# Patient Record
Sex: Female | Born: 1960 | Race: Black or African American | Hispanic: No | Smoking: Current every day smoker
Health system: Southern US, Community
[De-identification: ages and names within clinical notes are randomized; demographics above are authoritative.]

## PROBLEM LIST (undated history)

## (undated) DIAGNOSIS — E119 Type 2 diabetes mellitus without complications: Secondary | ICD-10-CM

## (undated) DIAGNOSIS — C801 Malignant (primary) neoplasm, unspecified: Secondary | ICD-10-CM

## (undated) DIAGNOSIS — M549 Dorsalgia, unspecified: Secondary | ICD-10-CM

## (undated) DIAGNOSIS — Z973 Presence of spectacles and contact lenses: Secondary | ICD-10-CM

## (undated) DIAGNOSIS — J302 Other seasonal allergic rhinitis: Secondary | ICD-10-CM

## (undated) DIAGNOSIS — Z972 Presence of dental prosthetic device (complete) (partial): Secondary | ICD-10-CM

## (undated) DIAGNOSIS — J45909 Unspecified asthma, uncomplicated: Secondary | ICD-10-CM

## (undated) DIAGNOSIS — Z9289 Personal history of other medical treatment: Secondary | ICD-10-CM

## (undated) DIAGNOSIS — G8929 Other chronic pain: Secondary | ICD-10-CM

## (undated) DIAGNOSIS — Z975 Presence of (intrauterine) contraceptive device: Secondary | ICD-10-CM

## (undated) DIAGNOSIS — Z01419 Encounter for gynecological examination (general) (routine) without abnormal findings: Secondary | ICD-10-CM

## (undated) DIAGNOSIS — L309 Dermatitis, unspecified: Secondary | ICD-10-CM

## (undated) HISTORY — DX: Encounter for gynecological examination (general) (routine) without abnormal findings: Z01.419

## (undated) HISTORY — DX: Presence of spectacles and contact lenses: Z97.3

## (undated) HISTORY — PX: MASTECTOMY: SHX3

## (undated) HISTORY — DX: Presence of dental prosthetic device (complete) (partial): Z97.2

## (undated) HISTORY — PX: APPENDECTOMY: SHX54

## (undated) HISTORY — DX: Dorsalgia, unspecified: M54.9

## (undated) HISTORY — PX: COLONOSCOPY: SHX174

## (undated) HISTORY — DX: Presence of (intrauterine) contraceptive device: Z97.5

## (undated) HISTORY — DX: Other seasonal allergic rhinitis: J30.2

## (undated) HISTORY — DX: Dermatitis, unspecified: L30.9

## (undated) HISTORY — DX: Personal history of other medical treatment: Z92.89

## (undated) HISTORY — DX: Other chronic pain: G89.29

## (undated) HISTORY — PX: OTHER SURGICAL HISTORY: SHX169

---

## 2002-05-09 ENCOUNTER — Encounter: Payer: Self-pay | Admitting: Emergency Medicine

## 2002-05-09 ENCOUNTER — Emergency Department (HOSPITAL_COMMUNITY): Admission: EM | Admit: 2002-05-09 | Discharge: 2002-05-09 | Payer: Self-pay | Admitting: Emergency Medicine

## 2003-02-21 DIAGNOSIS — Z9289 Personal history of other medical treatment: Secondary | ICD-10-CM

## 2003-02-21 HISTORY — DX: Personal history of other medical treatment: Z92.89

## 2003-06-24 ENCOUNTER — Encounter: Admission: RE | Admit: 2003-06-24 | Discharge: 2003-06-24 | Payer: Self-pay | Admitting: Internal Medicine

## 2003-07-08 ENCOUNTER — Encounter: Admission: RE | Admit: 2003-07-08 | Discharge: 2003-07-08 | Payer: Self-pay | Admitting: Internal Medicine

## 2004-02-26 ENCOUNTER — Encounter: Admission: RE | Admit: 2004-02-26 | Discharge: 2004-02-26 | Payer: Self-pay | Admitting: Internal Medicine

## 2006-01-25 ENCOUNTER — Emergency Department (HOSPITAL_COMMUNITY): Admission: EM | Admit: 2006-01-25 | Discharge: 2006-01-25 | Payer: Self-pay | Admitting: Family Medicine

## 2006-09-27 ENCOUNTER — Ambulatory Visit: Payer: Self-pay | Admitting: Internal Medicine

## 2006-09-28 ENCOUNTER — Ambulatory Visit: Payer: Self-pay | Admitting: *Deleted

## 2007-08-07 ENCOUNTER — Emergency Department (HOSPITAL_COMMUNITY): Admission: EM | Admit: 2007-08-07 | Discharge: 2007-08-07 | Payer: Self-pay | Admitting: Emergency Medicine

## 2007-08-23 ENCOUNTER — Emergency Department (HOSPITAL_COMMUNITY): Admission: EM | Admit: 2007-08-23 | Discharge: 2007-08-23 | Payer: Self-pay | Admitting: Emergency Medicine

## 2008-09-11 ENCOUNTER — Emergency Department (HOSPITAL_COMMUNITY): Admission: EM | Admit: 2008-09-11 | Discharge: 2008-09-11 | Payer: Self-pay | Admitting: Emergency Medicine

## 2008-10-20 ENCOUNTER — Emergency Department (HOSPITAL_COMMUNITY): Admission: EM | Admit: 2008-10-20 | Discharge: 2008-10-20 | Payer: Self-pay | Admitting: Family Medicine

## 2009-01-18 ENCOUNTER — Emergency Department (HOSPITAL_COMMUNITY): Admission: EM | Admit: 2009-01-18 | Discharge: 2009-01-18 | Payer: Self-pay | Admitting: Emergency Medicine

## 2009-04-06 ENCOUNTER — Emergency Department (HOSPITAL_COMMUNITY): Admission: EM | Admit: 2009-04-06 | Discharge: 2009-04-06 | Payer: Self-pay | Admitting: Family Medicine

## 2010-03-13 ENCOUNTER — Encounter: Payer: Self-pay | Admitting: Internal Medicine

## 2010-05-11 LAB — CBC
HCT: 37.4 % (ref 36.0–46.0)
Hemoglobin: 13 g/dL (ref 12.0–15.0)
MCHC: 34.8 g/dL (ref 30.0–36.0)
MCV: 92.5 fL (ref 78.0–100.0)
RBC: 4.04 MIL/uL (ref 3.87–5.11)
WBC: 7.5 10*3/uL (ref 4.0–10.5)

## 2010-05-11 LAB — DIFFERENTIAL
Eosinophils Absolute: 0.3 10*3/uL (ref 0.0–0.7)
Lymphs Abs: 1.6 10*3/uL (ref 0.7–4.0)
Neutro Abs: 5.1 10*3/uL (ref 1.7–7.7)

## 2010-05-11 LAB — URIC ACID: Uric Acid, Serum: 7.9 mg/dL — ABNORMAL HIGH (ref 2.4–7.0)

## 2010-05-25 LAB — DIFFERENTIAL
Basophils Absolute: 0 10*3/uL (ref 0.0–0.1)
Basophils Relative: 1 % (ref 0–1)
Eosinophils Absolute: 0.3 10*3/uL (ref 0.0–0.7)
Eosinophils Relative: 4 % (ref 0–5)
Lymphocytes Relative: 21 % (ref 12–46)
Lymphs Abs: 1.5 10*3/uL (ref 0.7–4.0)
Monocytes Absolute: 0.5 10*3/uL (ref 0.1–1.0)
Neutro Abs: 5 10*3/uL (ref 1.7–7.7)
Neutrophils Relative %: 68 % (ref 43–77)

## 2010-05-25 LAB — POCT PREGNANCY, URINE: Preg Test, Ur: NEGATIVE

## 2010-05-25 LAB — CBC
MCV: 92.9 fL (ref 78.0–100.0)
Platelets: 355 10*3/uL (ref 150–400)
WBC: 7.3 10*3/uL (ref 4.0–10.5)

## 2010-05-25 LAB — URINALYSIS, ROUTINE W REFLEX MICROSCOPIC
Glucose, UA: NEGATIVE mg/dL
Ketones, ur: NEGATIVE mg/dL
Leukocytes, UA: NEGATIVE
Nitrite: NEGATIVE

## 2010-05-25 LAB — COMPREHENSIVE METABOLIC PANEL
ALT: 22 U/L (ref 0–35)
AST: 24 U/L (ref 0–37)
Albumin: 3.6 g/dL (ref 3.5–5.2)
Calcium: 10.1 mg/dL (ref 8.4–10.5)
Chloride: 103 mEq/L (ref 96–112)
Creatinine, Ser: 0.58 mg/dL (ref 0.4–1.2)
GFR calc Af Amer: >60 mL/min (ref >60)
Sodium: 139 mEq/L (ref 135–145)

## 2010-05-25 LAB — URINE MICROSCOPIC-ADD ON

## 2010-11-19 ENCOUNTER — Inpatient Hospital Stay (INDEPENDENT_AMBULATORY_CARE_PROVIDER_SITE_OTHER)
Admission: RE | Admit: 2010-11-19 | Discharge: 2010-11-19 | Disposition: A | Discharge status: Home / Self Care | Payer: Self-pay | Source: Ambulatory Visit | Attending: Family Medicine | Admitting: Family Medicine

## 2010-11-19 DIAGNOSIS — S61209A Unspecified open wound of unspecified finger without damage to nail, initial encounter: Secondary | ICD-10-CM

## 2012-02-28 ENCOUNTER — Encounter (HOSPITAL_COMMUNITY): Payer: Self-pay | Admitting: *Deleted

## 2012-02-28 ENCOUNTER — Emergency Department (INDEPENDENT_AMBULATORY_CARE_PROVIDER_SITE_OTHER)
Admission: EM | Admit: 2012-02-28 | Discharge: 2012-02-28 | Disposition: A | Discharge status: Home / Self Care | Payer: Managed Care, Other (non HMO) | Source: Home / Self Care | Attending: Family Medicine | Admitting: Family Medicine

## 2012-02-28 DIAGNOSIS — H109 Unspecified conjunctivitis: Secondary | ICD-10-CM

## 2012-02-28 HISTORY — DX: Unspecified asthma, uncomplicated: J45.909

## 2012-02-28 MED ORDER — TOBRAMYCIN 0.3 % OP SOLN: 1.0000 [drp] | OPHTHALMIC | Status: DC

## 2012-02-28 MED ORDER — TRIAMCINOLONE ACETONIDE 0.025 % EX OINT: TOPICAL_OINTMENT | Freq: Two times a day (BID) | CUTANEOUS | Status: DC

## 2012-02-28 NOTE — ED Notes (Signed)
Pt  Has  Red  Irritated  r eye    That is  Watering   She reports  The   Symptoms  Began last  Pm     She  denys  Any known  fb      The  l  Eye   Is  Watery     As well

## 2012-02-28 NOTE — ED Provider Notes (Signed)
History     CSN: [id number]  Arrival date & time 02/28/12  1145   None     Chief Complaint  Patient presents with  . Conjunctivitis    (Consider location/radiation/quality/duration/timing/severity/associated sxs/prior treatment) Patient is a 52 y.o. female presenting with eye problem. The history is provided by the patient. No language interpreter was used.  Eye Problem  This is a new problem. The current episode started yesterday. The problem occurs constantly. The problem has not changed since onset.The left eye is affected.There was no injury mechanism. The pain is moderate. There is no history of trauma to the eye. There is no known exposure to pink eye. Associated symptoms include discharge and eye redness. Pertinent negatives include no foreign body sensation. She has tried nothing for the symptoms. The treatment provided no relief.   Pt complains of eye irritation and redness.  No injury Past Medical History  Diagnosis Date  . Asthma     History reviewed. No pertinent past surgical history.  No family history on file.  History  Substance Use Topics  . Smoking status: Current Every Day Smoker  . Smokeless tobacco: Not on file  . Alcohol Use: Yes    OB History    Grav Para Term Preterm Abortions TAB SAB Ect Mult Living                  Review of Systems  Eyes: Positive for discharge and redness.  All other systems reviewed and are negative.    Allergies  Review of patient's allergies indicates no known allergies.  Home Medications  No current outpatient prescriptions on file.  BP 156/87  Pulse 88  Temp 98.3 F (36.8 C) (Oral)  Resp 16  SpO2 100%  LMP 02/20/2012  Physical Exam  Vitals reviewed. Constitutional: She appears well-developed and well-nourished.  HENT:  Head: Normocephalic and atraumatic.  Eyes: Conjunctivae normal and EOM are normal. Pupils are equal, round, and reactive to light. Right eye exhibits discharge.       Drainage from left  eye, clear,  conjuntiva injected  Neck: Normal range of motion.  Cardiovascular: Normal rate.   Pulmonary/Chest: Effort normal.  Skin: Skin is warm and dry.  Psychiatric: She has a normal mood and affect.    ED Course  Procedures (including critical care time)  Labs Reviewed - No data to display No results found.   No diagnosis found.    MDM     rx for tobrex opth solution    Lonia Skinner [place], [place] 02/28/12 1421

## 2012-03-01 NOTE — ED Provider Notes (Signed)
Medical screening examination/treatment/procedure(s) were performed by non-physician practitioner and as supervising physician I was immediately available for consultation/collaboration.   MORENO-COLL,Yittel Emrich; MD   Kaidan Harpster Moreno-Coll, MD 03/01/12 0837 

## 2012-05-01 ENCOUNTER — Encounter: Payer: Self-pay | Admitting: Medical

## 2012-05-01 ENCOUNTER — Other Ambulatory Visit: Payer: Self-pay | Admitting: Medical

## 2012-05-01 ENCOUNTER — Telehealth: Payer: Self-pay | Admitting: Family Medicine

## 2012-05-01 ENCOUNTER — Other Ambulatory Visit (HOSPITAL_COMMUNITY)
Admission: RE | Admit: 2012-05-01 | Discharge: 2012-05-01 | Disposition: A | Discharge status: Home / Self Care | Payer: Managed Care, Other (non HMO) | Source: Ambulatory Visit | Attending: Medical | Admitting: Medical

## 2012-05-01 ENCOUNTER — Encounter: Payer: Self-pay | Admitting: Gastroenterology

## 2012-05-01 ENCOUNTER — Ambulatory Visit (INDEPENDENT_AMBULATORY_CARE_PROVIDER_SITE_OTHER): Payer: Managed Care, Other (non HMO) | Admitting: Medical

## 2012-05-01 VITALS — BP 130/80 | HR 68 | Temp 97.9°F | Resp 16 | Ht 67.5 in | Wt 186.0 lb

## 2012-05-01 DIAGNOSIS — N760 Acute vaginitis: Secondary | ICD-10-CM | POA: Insufficient documentation

## 2012-05-01 DIAGNOSIS — Z1231 Encounter for screening mammogram for malignant neoplasm of breast: Secondary | ICD-10-CM

## 2012-05-01 DIAGNOSIS — Z0000 Encounter for general adult medical examination without abnormal findings: Secondary | ICD-10-CM

## 2012-05-01 DIAGNOSIS — Z124 Encounter for screening for malignant neoplasm of cervix: Secondary | ICD-10-CM

## 2012-05-01 DIAGNOSIS — Z113 Encounter for screening for infections with a predominantly sexual mode of transmission: Secondary | ICD-10-CM

## 2012-05-01 DIAGNOSIS — N951 Menopausal and female climacteric states: Secondary | ICD-10-CM

## 2012-05-01 DIAGNOSIS — Z975 Presence of (intrauterine) contraceptive device: Secondary | ICD-10-CM

## 2012-05-01 DIAGNOSIS — Z30432 Encounter for removal of intrauterine contraceptive device: Secondary | ICD-10-CM

## 2012-05-01 DIAGNOSIS — Z01419 Encounter for gynecological examination (general) (routine) without abnormal findings: Secondary | ICD-10-CM | POA: Insufficient documentation

## 2012-05-01 DIAGNOSIS — Z1151 Encounter for screening for human papillomavirus (HPV): Secondary | ICD-10-CM | POA: Insufficient documentation

## 2012-05-01 DIAGNOSIS — F17200 Nicotine dependence, unspecified, uncomplicated: Secondary | ICD-10-CM

## 2012-05-01 DIAGNOSIS — Z1211 Encounter for screening for malignant neoplasm of colon: Secondary | ICD-10-CM

## 2012-05-01 DIAGNOSIS — Z1239 Encounter for other screening for malignant neoplasm of breast: Secondary | ICD-10-CM

## 2012-05-01 DIAGNOSIS — Z7189 Other specified counseling: Secondary | ICD-10-CM

## 2012-05-01 DIAGNOSIS — Z23 Encounter for immunization: Secondary | ICD-10-CM

## 2012-05-01 LAB — CBC WITH DIFFERENTIAL/PLATELET
Basophils Relative: 0 % (ref 0–1)
HCT: 42.3 % (ref 36.0–46.0)
Lymphocytes Relative: 24 % (ref 12–46)
Lymphs Abs: 1.9 10*3/uL (ref 0.7–4.0)
MCH: 30.3 pg (ref 26.0–34.0)
MCV: 89.1 fL (ref 78.0–100.0)
Monocytes Absolute: 0.6 10*3/uL (ref 0.1–1.0)
Monocytes Relative: 8 % (ref 3–12)
Neutrophils Relative %: 66 % (ref 43–77)
Platelets: 328 10*3/uL (ref 150–400)
RBC: 4.75 MIL/uL (ref 3.87–5.11)
RDW: 12.7 % (ref 11.5–15.5)

## 2012-05-01 LAB — COMPREHENSIVE METABOLIC PANEL
AST: 23 U/L (ref 0–37)
Alkaline Phosphatase: 91 U/L (ref 39–117)
BUN: 12 mg/dL (ref 6–23)
Calcium: 11.3 mg/dL — ABNORMAL HIGH (ref 8.4–10.5)
Chloride: 101 mEq/L (ref 96–112)
Creat: 0.71 mg/dL (ref 0.50–1.10)
Glucose, Bld: 185 mg/dL — ABNORMAL HIGH (ref 70–99)
Total Bilirubin: 0.3 mg/dL (ref 0.3–1.2)

## 2012-05-01 LAB — LIPID PANEL
HDL: 44 mg/dL (ref >39)
LDL Cholesterol: 142 mg/dL — ABNORMAL HIGH (ref 0–99)
Total CHOL/HDL Ratio: 5.2 Ratio

## 2012-05-01 LAB — POCT URINALYSIS DIPSTICK
Blood, UA: NEGATIVE
Spec Grav, UA: 1.02
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
## 2012-05-01 NOTE — Telephone Encounter (Signed)
Patient is aware of her appointment at [facility] on 05/06/12 @ 345 pm for her mammogram. CLS Patient is also aware of her appointment at [facility]  For her colonoscopy nurse visit 05/29/12 @ 130 pm and her colonoscopy 06/12/12 @ 900 am. CLS

## 2012-05-01 NOTE — Patient Instructions (Addendum)
Recommendations today:  Consider quitting tobacco  When we get the Tdap and pneumococcal vaccines come in, we will update these.  Call back if you haven't heard from [place] about this in 1 month  Go see eye doctor soon for baseline evaluation, recheck, glaucoma screening  We will call with lab results  Exercise regularly, eat a healthy low fat diet  Go for mammogram and first screening colonoscopy  consider the breast cancer gene screening   BRCA-1 and BRCA-2 BRCA-1 and BRCA-2 are 2 genes that are linked with hereditary breast and ovarian cancers. About 200,000 women are diagnosed with invasive breast cancer each year and about 23,000 with ovarian cancer (according to the American Cancer Society). Of these cancers, about 5% to 10% will be due to a mutation in one of the BRCA genes. Men can also inherit an increased risk of developing breast cancer, primarily from an alteration in the BRCA-2 gene.  Individuals with mutations in BRCA1 or BRCA2 have significantly elevated risks for breast cancer (up to 80% lifetime risk), ovarian cancer (up to 40% lifetime risk), bilateral breast cancer and other types of cancers. BRCA mutations are inherited and passed from generation to generation. One half of the time, they are passed from the father's side of the family.  The DNA in white blood cells is used to detect mutations in the BRCA genes. While the gene products (proteins) of the BRCA genes act only in breast and ovarian tissue, the genes are present in every cell of the body and blood is the most easily accessible source of that DNA. PREPARATION FOR TEST The test for BRCA mutations is done on a blood sample collected by needle from a vein in the arm. The test does not require surgical biopsy of breast or ovarian tissue.  NORMAL FINDINGS No genetic mutations. Ranges for normal findings may vary among different laboratories and hospitals. You should always check with your doctor after having lab work or  other tests done to discuss the meaning of your test results and whether your values are considered within normal limits. MEANING OF TEST  Your caregiver will go over the test results with you and discuss the importance and meaning of your results, as well as treatment options and the need for additional tests if necessary. OBTAINING THE TEST RESULTS It is your responsibility to obtain your test results. Ask the lab or department performing the test when and how you will get your results. OTHER THINGS TO KNOW Your test results may have implications for other family members. When one member of a family is tested for BRCA mutations, issues often arise about how or whether to share this information with other family members. Seek advice from a genetic counselor about communication of result with your family members.  Pre and post test consultation with a health care provider knowledgeable about genetic testing cannot be overemphasized.  There are many issues to be considered when preparing for a genetic test and upon learning the results, and a genetic counselor has the knowledge and experience to help you sort through them.  If the BRCA test is positive, the options include increased frequency of check-ups (e.g., mammography, blood tests for CA-125, or transvaginal ultrasonography); medications that could reduce risk (e.g., oral contraceptives or tamoxifen); or surgical removal of the ovaries or breasts. There are a number of variables involved and it is important to discuss your options with your doctor and genetic counselor. Research studies have reported that for every 1000 women negative  for BRCA mutations, between 12 and 45 of them will develop breast cancer by age 52 and between 3 and 4 will develop ovarian cancer by age 64. The risk increases with age. The test can be ordered by a doctor, preferably by one who can also offer genetic counseling. The blood sample will be sent to a laboratory that  specializes in BRCA testing. The American Society of Clinical Oncology and the National Breast Cancer Coalition encourage women seeking the test to participate in long-term outcome studies to help gather information on the effectiveness of different check-up and treatment options. Document Released: 03/02/2004 Document Revised: 05/01/2011 Document Reviewed: 01/13/2008 [facility] Patient Information 2013 [place], [place].     We removed the IUD today.    You may experience some cramping over the next few days.  You can take some Aleve OTC for this.    If moderate to severe abdominal/pelvic pain, fever, or bleeding, then recheck immediately.

## 2012-05-02 LAB — HIV ANTIBODY (ROUTINE TESTING W REFLEX): HIV: NONREACTIVE

## 2012-05-06 ENCOUNTER — Ambulatory Visit (HOSPITAL_COMMUNITY): Payer: Managed Care, Other (non HMO)

## 2012-05-08 ENCOUNTER — Institutional Professional Consult (permissible substitution): Payer: Self-pay | Admitting: Medical

## 2012-05-14 ENCOUNTER — Ambulatory Visit (HOSPITAL_COMMUNITY)
Admission: RE | Admit: 2012-05-14 | Discharge: 2012-05-14 | Disposition: A | Discharge status: Home / Self Care | Payer: Managed Care, Other (non HMO) | Source: Ambulatory Visit | Attending: Medical | Admitting: Medical

## 2012-05-14 DIAGNOSIS — Z1231 Encounter for screening mammogram for malignant neoplasm of breast: Secondary | ICD-10-CM

## 2012-05-20 ENCOUNTER — Ambulatory Visit (INDEPENDENT_AMBULATORY_CARE_PROVIDER_SITE_OTHER): Payer: Managed Care, Other (non HMO) | Admitting: Medical

## 2012-05-20 ENCOUNTER — Encounter: Payer: Self-pay | Admitting: Medical

## 2012-05-20 VITALS — BP 138/82 | HR 88 | Temp 97.8°F | Resp 16 | Wt 188.0 lb

## 2012-05-20 DIAGNOSIS — R7301 Impaired fasting glucose: Secondary | ICD-10-CM

## 2012-05-20 DIAGNOSIS — R232 Flushing: Secondary | ICD-10-CM

## 2012-05-20 DIAGNOSIS — F17200 Nicotine dependence, unspecified, uncomplicated: Secondary | ICD-10-CM

## 2012-05-20 DIAGNOSIS — N951 Menopausal and female climacteric states: Secondary | ICD-10-CM

## 2012-05-20 MED ORDER — METFORMIN HCL 500 MG PO TABS: 500.0000 mg | ORAL_TABLET | Freq: Two times a day (BID) | ORAL | Status: AC

## 2012-05-20 MED ORDER — VARENICLINE TARTRATE 0.5 MG X 11 & 1 MG X 42 PO MISC: ORAL | Status: DC

## 2012-05-20 NOTE — Patient Instructions (Addendum)
Impaired fasting glucose - new diagnosis of diabetes type II.   Begin Metformin once daily for a week, then go to twice daily.   I recommend exercising most days of the week using a type of exercise that they would enjoy and stick to such as walking, running, swimming, hiking, biking, aerobics, etc.  I recommend a healthy diet.    Do's:   whole grains such as whole grain pasta, rice, whole grains breads and whole grain cereals.  Use small quantities such as 1/2 cup per serving or 2 slices of bread per serving.    Eat 3-5 fruits daily  Eat beans at least once daily  Eat almonds in small quantities at least 3 days per week    If they eat meat, I recommend small portions of lean meats such as chicken, fish, and [place].  Eat as much NON corn and NON potato vegetables as they like, particularly raw or steamed  Drink several large glasses of water daily  Cautions:  Limit red meat  Limit corn and potatoes  Limit sweets, cake, pie, candy  Limit beer and alcohol  Avoid fried food, fast food, large portions  Avoid sugary drinks such as regular soda and sweet tea  Elevated calcium - we will call with results  Tobacco - I recommend you quit tobacco completely.  Begin Chantix medication, call the counseling hotline below, and go ahead and put a quit date on your refrigerator.  Work on exercising regularly, eating a healthy low fat diet, and trying to lose 15 lb in the next 3-4 months.  Perimenopause Perimenopause is the time when your body begins to move into the menopause (no menstrual period for 12 straight months). It is a natural process. Perimenopause can begin 2 to 8 years before the menopause and usually lasts for one year after the menopause. During this time, your ovaries may or may not produce an egg. The ovaries vary in their production of estrogen and progesterone hormones each month. This can cause irregular menstrual periods, difficulty in getting pregnant, vaginal bleeding  between periods and uncomfortable symptoms. CAUSES  Irregular production of the ovarian hormones, estrogen and progesterone, and not ovulating every month.  Other causes include:  Tumor of the pituitary gland in the brain.  Medical disease that affects the ovaries.  Radiation treatment.  Chemotherapy.  Unknown causes.  Heavy smoking and excessive alcohol intake can bring on perimenopause sooner. SYMPTOMS   Hot flashes.  Night sweats.  Irregular menstrual periods.  Decrease sex drive.  Vaginal dryness.  Headaches.  Mood swings.  Depression.  Memory problems.  Irritability.  Tiredness.  Weight gain.  Trouble getting pregnant.  The beginning of losing bone cells (osteoporosis).  The beginning of hardening of the arteries (atherosclerosis). DIAGNOSIS  Your caregiver will make a diagnosis by analyzing your age, menstrual history and your symptoms. They will do a physical exam noting any changes in your body, especially your female organs. Female hormone tests may or may not be helpful depending on the amount and when you produce the female hormones. However, other hormone tests may be helpful (ex. thyroid hormone) to rule out other problems. TREATMENT  The decision to treat during the perimenopause should be made by you and your caregiver depending on how the symptoms are affecting you and your life style. There are various treatments available such as:  Treating individual symptoms with a specific medication for that symptom (ex. antidepressant for depression).  Herbal medications that can help specific symptoms.  Counseling.  Group therapy.  No treatment. HOME CARE INSTRUCTIONS   Before seeing your caregiver, make a list of your menstrual periods (when the occur, how heavy they are, how long between periods and how long they last), your symptoms and when they started.  Take the medication as recommended by your caregiver.  Sleep and  rest.  Exercise.  Eat a diet that contains calcium (good for your bones) and soy (acts like estrogen hormone).  Do not smoke.  Avoid alcoholic beverages.  Taking vitamin E may help in certain cases.  Take calcium and vitamin D supplements to help prevent bone loss.  Group therapy is sometimes helpful.  Acupuncture may help in some cases. SEEK MEDICAL CARE IF:   You have any of the above and want to know if it is perimenopause.  You want advice and treatment for any of your symptoms mentioned above.  You need a referral to a specialist (gynecologist, psychiatrist or psychologist). SEEK IMMEDIATE MEDICAL CARE IF:   You have vaginal bleeding.  Your period lasts longer than 8 days.  You periods are recurring sooner than 21 days.  You have bleeding after intercourse.  You have severe depression.  You have pain when you urinate.  You have severe headaches.  You develop vision problems. Document Released: 03/16/2004 Document Revised: 05/01/2011 Document Reviewed: 12/05/2007 [facility] Patient Information 2013 [place], [place].    YOU CAN QUIT SMOKING!  Talk to your medical provider about using medicines to help you quit. These include nicotine replacement gum, lozenges, or skin patches.  Consider calling 1-800-QUIT-NOW, a toll free 24/7 hotline with free counseling to help you quit.  If you are ready to quit smoking or are thinking about it, congratulations! You have chosen to help yourself be healthier and live longer! There are lots of different ways to quit smoking. Nicotine gum, nicotine patches, a nicotine inhaler, or nicotine nasal spray can help with physical craving. Hypnosis, support groups, and medicines help break the habit of smoking. TIPS TO GET OFF AND STAY OFF CIGARETTES  Learn to predict your moods. Do not let a bad situation be your excuse to have a cigarette. Some situations in your life might tempt you to have a cigarette.   Ask friends and co-workers  not to smoke around you.   Make your home smoke-free.   Never have "just one" cigarette. It leads to wanting another and another. Remind yourself of your decision to quit.   On a card, make a list of your reasons for not smoking. Read it at least the same number of times a day as you have a cigarette. Tell yourself everyday, "I do not want to smoke. I choose not to smoke."   Ask someone at home or work to help you with your plan to quit smoking.   Have something planned after you eat or have a cup of coffee. Take a walk or get other exercise to perk you up. This will help to keep you from overeating.   Try a relaxation exercise to calm you down and decrease your stress. Remember, you may be tense and nervous the first two weeks after you quit. This will pass.   Find new activities to keep your hands busy. Play with a pen, coin, or rubber band. Doodle or draw things on paper.   Brush your teeth right after eating. This will help cut down the craving for the taste of tobacco after meals. You can try mouthwash too.   Try gum,  breath mints, or diet candy to keep something in your mouth.  IF YOU SMOKE AND WANT TO QUIT:  Do not stock up on cigarettes. Never buy a carton. Wait until one pack is finished before you buy another.   Never carry cigarettes with you at work or at home.   Keep cigarettes as far away from you as possible. Leave them with someone else.   Never carry matches or a lighter with you.   Ask yourself, "Do I need this cigarette or is this just a reflex?"   Bet with someone that you can quit. Put cigarette money in a piggy bank every morning. If you smoke, you give up the money. If you do not smoke, by the end of the week, you keep the money.   Keep trying. It takes 21 days to change a habit!  Document Released: 12/03/2008 Document Revised: 10/19/2010 Document Reviewed: 12/03/2008 [facility] Patient Information 2012 [place], [place].
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
## 2012-05-21 ENCOUNTER — Other Ambulatory Visit: Payer: Self-pay | Admitting: Medical

## 2012-05-21 DIAGNOSIS — E559 Vitamin D deficiency, unspecified: Secondary | ICD-10-CM

## 2012-05-21 DIAGNOSIS — F17200 Nicotine dependence, unspecified, uncomplicated: Secondary | ICD-10-CM

## 2012-05-21 LAB — PTH, INTACT AND CALCIUM
Calcium, Total (PTH): 10.8 mg/dL — ABNORMAL HIGH (ref 8.4–10.5)
PTH: 49.1 pg/mL (ref 14.0–72.0)

## 2012-05-21 LAB — PHOSPHORUS: Phosphorus: 3.3 mg/dL (ref 2.3–4.6)

## 2012-05-21 LAB — GLUCOSE, FASTING: Glucose, Fasting: 239 mg/dL — ABNORMAL HIGH (ref 70–99)

## 2012-05-21 MED ORDER — ERGOCALCIFEROL 1.25 MG (50000 UT) PO CAPS: 50000.0000 [IU] | ORAL_CAPSULE | ORAL | Status: DC

## 2012-05-27 ENCOUNTER — Ambulatory Visit
Admission: RE | Admit: 2012-05-27 | Discharge: 2012-05-27 | Disposition: A | Discharge status: Home / Self Care | Payer: Managed Care, Other (non HMO) | Source: Ambulatory Visit | Attending: Medical | Admitting: Medical

## 2012-05-27 DIAGNOSIS — F17200 Nicotine dependence, unspecified, uncomplicated: Secondary | ICD-10-CM

## 2012-06-05 ENCOUNTER — Encounter: Payer: Self-pay | Admitting: Internal Medicine

## 2012-06-05 ENCOUNTER — Telehealth: Payer: Self-pay | Admitting: *Deleted

## 2012-06-05 NOTE — Telephone Encounter (Signed)
Patient did not show for previsit appointment at 800 am 06/05/12. Phoned patient, she thought appointment was at 9 am, she is unable to come any other time today. Colonoscopy scheduled for 06/12/12. No previsit appointments available before procedure date. Patient desires to cancel appointment and she will call back at another time and reschedule both previsit and colonoscopy.

## 2012-06-12 ENCOUNTER — Encounter: Payer: Managed Care, Other (non HMO) | Admitting: Gastroenterology

## 2012-06-19 ENCOUNTER — Ambulatory Visit: Payer: Managed Care, Other (non HMO) | Admitting: Medical

## 2013-08-20 ENCOUNTER — Telehealth: Payer: Self-pay | Admitting: Internal Medicine

## 2013-08-20 NOTE — Telephone Encounter (Signed)
Faxed medical records over to [facility] medicine @ [phone]

## 2016-07-25 ENCOUNTER — Ambulatory Visit (INDEPENDENT_AMBULATORY_CARE_PROVIDER_SITE_OTHER): Payer: Managed Care, Other (non HMO) | Admitting: Physician Assistant

## 2017-01-01 ENCOUNTER — Emergency Department (HOSPITAL_COMMUNITY): Payer: Medicare HMO

## 2017-01-01 ENCOUNTER — Other Ambulatory Visit: Payer: Self-pay

## 2017-01-01 ENCOUNTER — Encounter (HOSPITAL_COMMUNITY): Payer: Self-pay

## 2017-01-01 ENCOUNTER — Emergency Department (HOSPITAL_COMMUNITY)
Admission: EM | Admit: 2017-01-01 | Discharge: 2017-01-01 | Disposition: A | Discharge status: Home / Self Care | Payer: Medicare HMO | Attending: Emergency Medicine | Admitting: Emergency Medicine

## 2017-01-01 DIAGNOSIS — K529 Noninfective gastroenteritis and colitis, unspecified: Secondary | ICD-10-CM

## 2017-01-01 DIAGNOSIS — R112 Nausea with vomiting, unspecified: Secondary | ICD-10-CM | POA: Diagnosis not present

## 2017-01-01 DIAGNOSIS — Z853 Personal history of malignant neoplasm of breast: Secondary | ICD-10-CM | POA: Insufficient documentation

## 2017-01-01 DIAGNOSIS — R1084 Generalized abdominal pain: Secondary | ICD-10-CM | POA: Diagnosis present

## 2017-01-01 DIAGNOSIS — J45909 Unspecified asthma, uncomplicated: Secondary | ICD-10-CM | POA: Diagnosis not present

## 2017-01-01 DIAGNOSIS — E119 Type 2 diabetes mellitus without complications: Secondary | ICD-10-CM | POA: Insufficient documentation

## 2017-01-01 DIAGNOSIS — F17200 Nicotine dependence, unspecified, uncomplicated: Secondary | ICD-10-CM | POA: Insufficient documentation

## 2017-01-01 DIAGNOSIS — R197 Diarrhea, unspecified: Secondary | ICD-10-CM

## 2017-01-01 HISTORY — DX: Malignant (primary) neoplasm, unspecified: C80.1

## 2017-01-01 HISTORY — DX: Type 2 diabetes mellitus without complications: E11.9

## 2017-01-01 LAB — I-STAT CG4 LACTIC ACID, ED
LACTIC ACID, VENOUS: 3.31 mmol/L — AB (ref 0.5–1.9)
Lactic Acid, Venous: 3.05 mmol/L (ref 0.5–1.9)
Lactic Acid, Venous: 4 mmol/L (ref 0.5–1.9)

## 2017-01-01 LAB — CBC
HCT: 42.8 % (ref 36.0–46.0)
Hemoglobin: 14.8 g/dL (ref 12.0–15.0)
MCH: 30.8 pg (ref 26.0–34.0)
MCHC: 34.6 g/dL (ref 30.0–36.0)
MCV: 89 fL (ref 78.0–100.0)
Platelets: 330 10*3/uL (ref 150–400)
RBC: 4.81 MIL/uL (ref 3.87–5.11)
RDW: 12.6 % (ref 11.5–15.5)
WBC: 7.7 10*3/uL (ref 4.0–10.5)

## 2017-01-01 LAB — COMPREHENSIVE METABOLIC PANEL
ALT: 29 U/L (ref 14–54)
AST: 35 U/L (ref 15–41)
Albumin: 4.7 g/dL (ref 3.5–5.0)
Alkaline Phosphatase: 82 U/L (ref 38–126)
Anion gap: 11 (ref 5–15)
BILIRUBIN TOTAL: 0.6 mg/dL (ref 0.3–1.2)
BUN: 10 mg/dL (ref 6–20)
CALCIUM: 10.5 mg/dL — AB (ref 8.9–10.3)
CO2: 23 mmol/L (ref 22–32)
Chloride: 103 mmol/L (ref 101–111)
Creatinine, Ser: 0.74 mg/dL (ref 0.44–1.00)
GFR calc non Af Amer: >60 mL/min (ref >60)
Glucose, Bld: 198 mg/dL — ABNORMAL HIGH (ref 65–99)
Potassium: 3.7 mmol/L (ref 3.5–5.1)
SODIUM: 137 mmol/L (ref 135–145)
Total Protein: 7.9 g/dL (ref 6.5–8.1)

## 2017-01-01 LAB — URINALYSIS, ROUTINE W REFLEX MICROSCOPIC
BACTERIA UA: NONE SEEN
BILIRUBIN URINE: NEGATIVE
Glucose, UA: NEGATIVE mg/dL
Ketones, ur: 5 mg/dL — AB
LEUKOCYTES UA: NEGATIVE
NITRITE: NEGATIVE
Protein, ur: NEGATIVE mg/dL
pH: 5 (ref 5.0–8.0)

## 2017-01-01 LAB — I-STAT TROPONIN, ED: Troponin i, poc: 0 ng/mL (ref 0.00–0.08)

## 2017-01-01 LAB — LIPASE, BLOOD: Lipase: 30 U/L (ref 11–51)

## 2017-01-01 MED ORDER — MORPHINE SULFATE (PF) 4 MG/ML IV SOLN
4.0000 mg | Freq: Once | INTRAVENOUS | Status: AC
  Administered 2017-01-01: 4 mg via INTRAVENOUS
  Filled 2017-01-01: qty 1

## 2017-01-01 MED ORDER — SODIUM CHLORIDE 0.9 % IV BOLUS (SEPSIS)
1000.0000 mL | Freq: Once | INTRAVENOUS | Status: AC
  Administered 2017-01-01: 1000 mL via INTRAVENOUS

## 2017-01-01 MED ORDER — LOPERAMIDE HCL 2 MG PO CAPS: 2.0000 mg | ORAL_CAPSULE | Freq: Every evening | ORAL | 0 refills | Status: DC | PRN

## 2017-01-01 MED ORDER — ONDANSETRON HCL 4 MG/2ML IJ SOLN
4.0000 mg | Freq: Once | INTRAMUSCULAR | Status: AC
  Administered 2017-01-01: 4 mg via INTRAVENOUS
  Filled 2017-01-01: qty 2

## 2017-01-01 MED ORDER — ONDANSETRON 4 MG PO TBDP
4.0000 mg | ORAL_TABLET | Freq: Once | ORAL | Status: AC | PRN
  Administered 2017-01-01: 4 mg via ORAL
  Filled 2017-01-01: qty 1

## 2017-01-01 MED ORDER — FENTANYL CITRATE (PF) 100 MCG/2ML IJ SOLN
50.0000 ug | INTRAMUSCULAR | Status: DC | PRN
  Administered 2017-01-01: 50 ug via NASAL
  Filled 2017-01-01: qty 2

## 2017-01-01 MED ORDER — ONDANSETRON 4 MG PO TBDP: 4.0000 mg | ORAL_TABLET | Freq: Three times a day (TID) | ORAL | Status: DC | PRN

## 2017-01-01 MED ORDER — IOPAMIDOL (ISOVUE-300) INJECTION 61%
INTRAVENOUS | Status: AC
  Administered 2017-01-01: 100 mL
  Filled 2017-01-01: qty 100

## 2017-01-01 MED ORDER — ONDANSETRON 8 MG PO TBDP: 8.0000 mg | ORAL_TABLET | Freq: Three times a day (TID) | ORAL | 0 refills | Status: DC | PRN

## 2017-01-01 MED ORDER — IBUPROFEN 600 MG PO TABS: 600.0000 mg | ORAL_TABLET | Freq: Four times a day (QID) | ORAL | 0 refills | Status: AC | PRN
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
## 2017-01-01 NOTE — ED Triage Notes (Signed)
Per Pt, Pt is coming from home with complaints of severe generalized abdominal pain that started this morning. Reports N/V. Pt has hx of diabetes.

## 2017-01-01 NOTE — ED Notes (Signed)
Pt drank cup of ginger ale with no issues, except it was too cold.

## 2017-01-01 NOTE — ED Notes (Signed)
I Stat Lactic Acid results shown to [employer] PA

## 2017-01-01 NOTE — Discharge Instructions (Signed)
We are not sure the exact cause behind your nausea, vomiting and diarrhea. We suspect your symptoms could be due to a mild intestinal infection, and if so, symptoms iwll improve over the next 3-5 days.  Please hydrate well. Clear liquid diet should be utilized for the first 2-3 days. Take the meds prescribed for your symptoms, and take the diarrhea medicines only at night time.  Please return to the ER if your symptoms worsen; you have increased pain, fevers, chills, inability to keep any medications down, confusion, bloody stools. Otherwise see the outpatient doctor as requested.

## 2017-01-01 NOTE — ED Provider Notes (Addendum)
  Physical Exam  BP (!) 175/79   Pulse 76   Temp (!) 97.5 F (36.4 C) (Oral)   Resp (!) 24   Ht 5\' 8"  (1.727 m)   Wt 81.6 kg (180 lb)   LMP 04/19/2012   SpO2 100%   BMI 27.37 kg/m   Physical Exam  ED Course  Procedures  MDM  Assuming care of patient at 4:00 pm  Patient came in with chief complaint of abdominal pain, nausea, vomiting.  Patient has history of diabetes.  CT scan of the abdomen was obtained and did not show any acute findings  Concerning findings are as following elevated lactate. Important pending results are repeat lactate.  Patient had no complains, no concerns from the nursing side. Will continue to monitor. Pt is feeling better.  Plan is to start oral challenge, ensure patient is get to saline boluses, repeat lactate.  6:03 PM Patient's lactate went up from 3->4.  I reassessed patient and she reports that she is indeed feeling better.  She has been given some ginger ale and has tolerated that thus far.  On exam patient has no focal abdominal tenderness or peritoneal findings.  She denies any chest pain, shortness of breath, new back pain, flulike symptoms.  Patient was doing well yesterday, and even clean her home.   Lower extremity pulses equal.  Cap refill is less than 3 seconds.  Elevated lactate could be because of metformin, and not due to sepsis or severe hypovolemia. Plan is to watch patient in the ER a little bit longer.  Repeat lactate will be ordered.  Results from the ER workup discussed with the patient face to face and all questions answered to the best of my ability.   Varney Biles, MD 01/01/17 1805  8:31 PM Patient's repeat lactate is 3.35. No IV hydration was given after the previous lactate.  Patient continues to feel well.  Stable for discharge. Strict ER return precautions have been discussed, and patient is agreeing with the plan and is comfortable with the workup done and the recommendations from the ER.    Varney Biles,  MD 01/01/17 2032

## 2017-01-01 NOTE — ED Notes (Signed)
Pt requested ibuprofen for toothache from a dental procedure earlier in the week.

## 2017-01-01 NOTE — ED Notes (Signed)
Pt taken to CT.

## 2017-01-01 NOTE — ED Notes (Signed)
Pt unable to get urine sample at this time  

## 2017-01-01 NOTE — ED Notes (Signed)
Pt verbalized understanding of d/c instructions and has no further questions. VSS, NAD. Pt removed all belongings from room.  

## 2017-08-02 ENCOUNTER — Telehealth: Payer: Self-pay | Admitting: Nurse Practitioner

## 2017-08-02 NOTE — Telephone Encounter (Signed)
Appointments scheduled letter/calendar mailed per 6/13 los

## 2018-01-20 ENCOUNTER — Other Ambulatory Visit: Payer: Self-pay

## 2018-01-20 ENCOUNTER — Emergency Department (HOSPITAL_COMMUNITY)
Admission: EM | Admit: 2018-01-20 | Discharge: 2018-01-20 | Disposition: A | Discharge status: Home / Self Care | Payer: Medicare HMO | Attending: Emergency Medicine | Admitting: Emergency Medicine

## 2018-01-20 ENCOUNTER — Emergency Department (HOSPITAL_COMMUNITY): Payer: Medicare HMO

## 2018-01-20 ENCOUNTER — Encounter (HOSPITAL_COMMUNITY): Payer: Self-pay | Admitting: *Deleted

## 2018-01-20 DIAGNOSIS — R51 Headache: Secondary | ICD-10-CM | POA: Insufficient documentation

## 2018-01-20 DIAGNOSIS — E119 Type 2 diabetes mellitus without complications: Secondary | ICD-10-CM | POA: Insufficient documentation

## 2018-01-20 DIAGNOSIS — J069 Acute upper respiratory infection, unspecified: Secondary | ICD-10-CM | POA: Diagnosis not present

## 2018-01-20 DIAGNOSIS — F17210 Nicotine dependence, cigarettes, uncomplicated: Secondary | ICD-10-CM | POA: Diagnosis not present

## 2018-01-20 DIAGNOSIS — Z79899 Other long term (current) drug therapy: Secondary | ICD-10-CM | POA: Insufficient documentation

## 2018-01-20 DIAGNOSIS — Z853 Personal history of malignant neoplasm of breast: Secondary | ICD-10-CM | POA: Diagnosis not present

## 2018-01-20 DIAGNOSIS — J4531 Mild persistent asthma with (acute) exacerbation: Secondary | ICD-10-CM | POA: Diagnosis not present

## 2018-01-20 DIAGNOSIS — R062 Wheezing: Secondary | ICD-10-CM | POA: Diagnosis present

## 2018-01-20 LAB — CBG MONITORING, ED: Glucose-Capillary: 129 mg/dL — ABNORMAL HIGH (ref 70–99)

## 2018-01-20 MED ORDER — PREDNISONE 20 MG PO TABS: 40.0000 mg | ORAL_TABLET | Freq: Every day | ORAL | 0 refills | Status: DC

## 2018-01-20 MED ORDER — ALBUTEROL SULFATE HFA 108 (90 BASE) MCG/ACT IN AERS: 2.0000 | INHALATION_SPRAY | RESPIRATORY_TRACT | Status: DC | PRN

## 2018-01-20 MED ORDER — ALBUTEROL SULFATE (2.5 MG/3ML) 0.083% IN NEBU
5.0000 mg | INHALATION_SOLUTION | Freq: Once | RESPIRATORY_TRACT | Status: AC
  Administered 2018-01-20: 5 mg via RESPIRATORY_TRACT
  Filled 2018-01-20: qty 6

## 2018-01-20 MED ORDER — PREDNISONE 20 MG PO TABS
60.0000 mg | ORAL_TABLET | Freq: Once | ORAL | Status: AC
  Administered 2018-01-20: 60 mg via ORAL
  Filled 2018-01-20: qty 3

## 2018-01-20 MED ORDER — IPRATROPIUM BROMIDE 0.02 % IN SOLN
0.5000 mg | Freq: Once | RESPIRATORY_TRACT | Status: AC
  Administered 2018-01-20: 0.5 mg via RESPIRATORY_TRACT
  Filled 2018-01-20: qty 2.5
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
## 2018-01-20 NOTE — ED Triage Notes (Signed)
Pt reports asthma problems and can not breath. Pt ha sused the [facility] with out relief

## 2018-01-20 NOTE — ED Notes (Signed)
Patient able to ambulate independently  

## 2018-07-06 ENCOUNTER — Other Ambulatory Visit: Payer: Self-pay

## 2018-07-06 ENCOUNTER — Emergency Department (HOSPITAL_COMMUNITY)
Admission: EM | Admit: 2018-07-06 | Discharge: 2018-07-07 | Disposition: A | Discharge status: Home / Self Care | Payer: Medicare HMO | Attending: Emergency Medicine | Admitting: Emergency Medicine

## 2018-07-06 ENCOUNTER — Encounter (HOSPITAL_COMMUNITY): Payer: Self-pay | Admitting: Emergency Medicine

## 2018-07-06 DIAGNOSIS — G8929 Other chronic pain: Secondary | ICD-10-CM | POA: Insufficient documentation

## 2018-07-06 DIAGNOSIS — F17210 Nicotine dependence, cigarettes, uncomplicated: Secondary | ICD-10-CM | POA: Diagnosis not present

## 2018-07-06 DIAGNOSIS — Z853 Personal history of malignant neoplasm of breast: Secondary | ICD-10-CM | POA: Diagnosis not present

## 2018-07-06 DIAGNOSIS — E872 Acidosis: Secondary | ICD-10-CM

## 2018-07-06 DIAGNOSIS — R1013 Epigastric pain: Secondary | ICD-10-CM | POA: Diagnosis not present

## 2018-07-06 DIAGNOSIS — E119 Type 2 diabetes mellitus without complications: Secondary | ICD-10-CM | POA: Diagnosis not present

## 2018-07-06 DIAGNOSIS — R112 Nausea with vomiting, unspecified: Secondary | ICD-10-CM | POA: Diagnosis not present

## 2018-07-06 DIAGNOSIS — M549 Dorsalgia, unspecified: Secondary | ICD-10-CM | POA: Insufficient documentation

## 2018-07-06 DIAGNOSIS — E8729 Other acidosis: Secondary | ICD-10-CM

## 2018-07-06 LAB — CBC
HCT: 45.8 % (ref 36.0–46.0)
Hemoglobin: 15.7 g/dL — ABNORMAL HIGH (ref 12.0–15.0)
MCH: 30.1 pg (ref 26.0–34.0)
MCHC: 34.3 g/dL (ref 30.0–36.0)
MCV: 87.9 fL (ref 80.0–100.0)
Platelets: 354 10*3/uL (ref 150–400)
RBC: 5.21 MIL/uL — ABNORMAL HIGH (ref 3.87–5.11)
RDW: 12.8 % (ref 11.5–15.5)
WBC: 12.9 10*3/uL — ABNORMAL HIGH (ref 4.0–10.5)
nRBC: 0 % (ref 0.0–0.2)

## 2018-07-06 LAB — URINALYSIS, ROUTINE W REFLEX MICROSCOPIC
Bacteria, UA: NONE SEEN
Bilirubin Urine: NEGATIVE
Glucose, UA: >=500 mg/dL — AB
Ketones, ur: 80 mg/dL — AB
Leukocytes,Ua: NEGATIVE
Nitrite: NEGATIVE
Protein, ur: >=300 mg/dL — AB
Specific Gravity, Urine: 1.027 (ref 1.005–1.030)
pH: 5 (ref 5.0–8.0)

## 2018-07-06 LAB — COMPREHENSIVE METABOLIC PANEL
ALT: 31 U/L (ref 0–44)
AST: 30 U/L (ref 15–41)
Albumin: 4.9 g/dL (ref 3.5–5.0)
Alkaline Phosphatase: 95 U/L (ref 38–126)
Anion gap: 17 — ABNORMAL HIGH (ref 5–15)
BUN: 7 mg/dL (ref 6–20)
CO2: 24 mmol/L (ref 22–32)
Calcium: 11.3 mg/dL — ABNORMAL HIGH (ref 8.9–10.3)
Chloride: 99 mmol/L (ref 98–111)
Creatinine, Ser: 0.87 mg/dL (ref 0.44–1.00)
GFR calc Af Amer: >60 mL/min (ref >60)
GFR calc non Af Amer: >60 mL/min (ref >60)
Glucose, Bld: 260 mg/dL — ABNORMAL HIGH (ref 70–99)
Potassium: 4.7 mmol/L (ref 3.5–5.1)
Sodium: 140 mmol/L (ref 135–145)
Total Bilirubin: 0.6 mg/dL (ref 0.3–1.2)
Total Protein: 8.6 g/dL — ABNORMAL HIGH (ref 6.5–8.1)

## 2018-07-06 LAB — I-STAT BETA HCG BLOOD, ED (MC, WL, AP ONLY): I-stat hCG, quantitative: <5 m[IU]/mL (ref <5)

## 2018-07-06 LAB — LIPASE, BLOOD: Lipase: 30 U/L (ref 11–51)

## 2018-07-06 MED ORDER — PANTOPRAZOLE SODIUM 40 MG IV SOLR
40.0000 mg | Freq: Once | INTRAVENOUS | Status: AC
  Administered 2018-07-06: 40 mg via INTRAVENOUS
  Filled 2018-07-06: qty 40

## 2018-07-06 MED ORDER — MORPHINE SULFATE (PF) 4 MG/ML IV SOLN
4.0000 mg | Freq: Once | INTRAVENOUS | Status: AC
  Administered 2018-07-06: 4 mg via INTRAVENOUS
  Filled 2018-07-06: qty 1

## 2018-07-06 MED ORDER — ONDANSETRON HCL 4 MG/2ML IJ SOLN
4.0000 mg | Freq: Once | INTRAMUSCULAR | Status: AC
  Administered 2018-07-06: 4 mg via INTRAVENOUS
  Filled 2018-07-06: qty 2

## 2018-07-06 MED ORDER — ALUM & MAG HYDROXIDE-SIMETH 200-200-20 MG/5ML PO SUSP
30.0000 mL | Freq: Once | ORAL | Status: AC
  Administered 2018-07-06: 30 mL via ORAL
  Filled 2018-07-06: qty 30

## 2018-07-06 MED ORDER — DICYCLOMINE HCL 10 MG/ML IM SOLN
20.0000 mg | Freq: Once | INTRAMUSCULAR | Status: AC
  Administered 2018-07-06: 20 mg via INTRAMUSCULAR
  Filled 2018-07-06: qty 2

## 2018-07-06 MED ORDER — SODIUM CHLORIDE 0.9 % IV BOLUS
1000.0000 mL | Freq: Once | INTRAVENOUS | Status: AC
  Administered 2018-07-06: 1000 mL via INTRAVENOUS

## 2018-07-06 MED ORDER — SODIUM CHLORIDE 0.9 % IV BOLUS
500.0000 mL | Freq: Once | INTRAVENOUS | Status: AC
  Administered 2018-07-06: 500 mL via INTRAVENOUS

## 2018-07-06 MED ORDER — SODIUM CHLORIDE 0.9% FLUSH: 3.0000 mL | Freq: Once | INTRAVENOUS | Status: DC

## 2018-07-06 MED ORDER — LIDOCAINE VISCOUS HCL 2 % MT SOLN
15.0000 mL | Freq: Once | OROMUCOSAL | Status: AC
  Administered 2018-07-06: 15 mL via ORAL
  Filled 2018-07-06: qty 15

## 2018-07-06 NOTE — ED Notes (Signed)
Pt providing urine specimen at this time.
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
## 2018-07-06 NOTE — ED Notes (Signed)
Pt given ginger ale.

## 2018-07-06 NOTE — ED Triage Notes (Signed)
Pt states she has been having abdominal pain x 1 day. States she drank alcohol last night and thinks she drank too much. Pain is right upper quadrant. States she has had vomiting x 4 times today. Denies fever,SOB, cough.

## 2018-07-07 DIAGNOSIS — R1013 Epigastric pain: Secondary | ICD-10-CM | POA: Diagnosis not present

## 2018-07-07 LAB — BASIC METABOLIC PANEL
Anion gap: 10 (ref 5–15)
BUN: 9 mg/dL (ref 6–20)
CO2: 21 mmol/L — ABNORMAL LOW (ref 22–32)
Calcium: 9.3 mg/dL (ref 8.9–10.3)
Chloride: 105 mmol/L (ref 98–111)
Creatinine, Ser: 0.8 mg/dL (ref 0.44–1.00)
GFR calc Af Amer: >60 mL/min (ref >60)
GFR calc non Af Amer: >60 mL/min (ref >60)
Glucose, Bld: 198 mg/dL — ABNORMAL HIGH (ref 70–99)
Potassium: 4.9 mmol/L (ref 3.5–5.1)
Sodium: 136 mmol/L (ref 135–145)

## 2018-07-07 MED ORDER — ONDANSETRON 4 MG PO TBDP: ORAL_TABLET | ORAL | 0 refills | Status: DC

## 2018-07-07 MED ORDER — PANTOPRAZOLE SODIUM 20 MG PO TBEC: 20.0000 mg | DELAYED_RELEASE_TABLET | Freq: Every day | ORAL | 0 refills | Status: DC

## 2018-07-07 NOTE — Discharge Instructions (Signed)
I suspect that your pain was likely related to irritation and inflammation of your stomach lining from alcohol.  Please avoid drinking.  You can use Zofran as needed for nausea, please begin taking Protonix once daily before your first meal of the day to help reduce acid production and irritation of your stomach.  You can also use over-the-counter Maalox or Tums for breakthrough symptoms.  Use the information provided for dietary modifications, again avoid alcohol, NSAIDs such as ibuprofen, Aleve or aspirin, and spicy or acidic foods.  Follow-up with your primary care doctor if symptoms persist.  Return to the emergency department if you have worsening abdominal pain, have blood in your vomit, dark black or bloody stools, fevers or any other new or concerning symptoms.

## 2018-07-07 NOTE — ED Provider Notes (Signed)
1:55 AM Handoff from [employer] at shift change.  Patient with history of alcohol abuse, diabetes here with epigastric pain tonight.  She was noted to have ketosis on her lab work, likely due to her alcohol use.  She does not have other symptoms suggestive of DKA.  Patient was hydrated.  BMP was rechecked with resolution of mild anion gap elevation.  Patient is feeling much better now.  She is drinking without any difficulty.  Ready for discharge.  BP (!) 149/59   Pulse 72   Temp 97.8 F (36.6 C) (Oral)   Resp 19   LMP 04/19/2012   SpO2 98%     Carlisle Cater, PA-C 07/07/18 4314    Gareth Morgan, MD 07/09/18 [phone]

## 2018-08-12 ENCOUNTER — Other Ambulatory Visit: Payer: Self-pay

## 2018-08-12 ENCOUNTER — Emergency Department (HOSPITAL_COMMUNITY)
Admission: EM | Admit: 2018-08-12 | Discharge: 2018-08-12 | Disposition: A | Discharge status: Home / Self Care | Payer: Medicare HMO | Attending: Emergency Medicine | Admitting: Emergency Medicine

## 2018-08-12 ENCOUNTER — Emergency Department (HOSPITAL_COMMUNITY): Payer: Medicare HMO

## 2018-08-12 DIAGNOSIS — E119 Type 2 diabetes mellitus without complications: Secondary | ICD-10-CM | POA: Insufficient documentation

## 2018-08-12 DIAGNOSIS — F17200 Nicotine dependence, unspecified, uncomplicated: Secondary | ICD-10-CM | POA: Diagnosis not present

## 2018-08-12 DIAGNOSIS — R109 Unspecified abdominal pain: Secondary | ICD-10-CM

## 2018-08-12 DIAGNOSIS — J45909 Unspecified asthma, uncomplicated: Secondary | ICD-10-CM | POA: Diagnosis not present

## 2018-08-12 DIAGNOSIS — R1030 Lower abdominal pain, unspecified: Secondary | ICD-10-CM | POA: Insufficient documentation

## 2018-08-12 DIAGNOSIS — R1084 Generalized abdominal pain: Secondary | ICD-10-CM

## 2018-08-12 DIAGNOSIS — Z79899 Other long term (current) drug therapy: Secondary | ICD-10-CM | POA: Insufficient documentation

## 2018-08-12 LAB — CBC
HCT: 47.1 % — ABNORMAL HIGH (ref 36.0–46.0)
Hemoglobin: 16 g/dL — ABNORMAL HIGH (ref 12.0–15.0)
MCH: 30.2 pg (ref 26.0–34.0)
MCHC: 34 g/dL (ref 30.0–36.0)
MCV: 89 fL (ref 80.0–100.0)
Platelets: 406 10*3/uL — ABNORMAL HIGH (ref 150–400)
RBC: 5.29 MIL/uL — ABNORMAL HIGH (ref 3.87–5.11)
RDW: 12.1 % (ref 11.5–15.5)
WBC: 11.4 10*3/uL — ABNORMAL HIGH (ref 4.0–10.5)
nRBC: 0 % (ref 0.0–0.2)

## 2018-08-12 LAB — URINALYSIS, ROUTINE W REFLEX MICROSCOPIC
Bacteria, UA: NONE SEEN
Bilirubin Urine: NEGATIVE
Glucose, UA: >=500 mg/dL — AB
Hgb urine dipstick: NEGATIVE
Ketones, ur: 5 mg/dL — AB
Leukocytes,Ua: NEGATIVE
Nitrite: NEGATIVE
Protein, ur: NEGATIVE mg/dL
Specific Gravity, Urine: 1.035 — ABNORMAL HIGH (ref 1.005–1.030)
pH: 6 (ref 5.0–8.0)

## 2018-08-12 LAB — COMPREHENSIVE METABOLIC PANEL
ALT: 29 U/L (ref 0–44)
AST: 27 U/L (ref 15–41)
Albumin: 4.6 g/dL (ref 3.5–5.0)
Alkaline Phosphatase: 101 U/L (ref 38–126)
Anion gap: 17 — ABNORMAL HIGH (ref 5–15)
BUN: 6 mg/dL (ref 6–20)
CO2: 21 mmol/L — ABNORMAL LOW (ref 22–32)
Calcium: 11 mg/dL — ABNORMAL HIGH (ref 8.9–10.3)
Chloride: 102 mmol/L (ref 98–111)
Creatinine, Ser: 0.64 mg/dL (ref 0.44–1.00)
GFR calc Af Amer: >60 mL/min (ref >60)
GFR calc non Af Amer: >60 mL/min (ref >60)
Glucose, Bld: 275 mg/dL — ABNORMAL HIGH (ref 70–99)
Potassium: 3.6 mmol/L (ref 3.5–5.1)
Sodium: 140 mmol/L (ref 135–145)
Total Bilirubin: 0.7 mg/dL (ref 0.3–1.2)
Total Protein: 8.5 g/dL — ABNORMAL HIGH (ref 6.5–8.1)

## 2018-08-12 LAB — LIPASE, BLOOD: Lipase: 49 U/L (ref 11–51)

## 2018-08-12 MED ORDER — ONDANSETRON HCL 4 MG/2ML IJ SOLN
4.0000 mg | Freq: Once | INTRAMUSCULAR | Status: AC
  Administered 2018-08-12: 4 mg via INTRAVENOUS
  Filled 2018-08-12: qty 2

## 2018-08-12 MED ORDER — SODIUM CHLORIDE 0.9 % IV BOLUS (SEPSIS)
1000.0000 mL | Freq: Once | INTRAVENOUS | Status: AC
  Administered 2018-08-12: 1000 mL via INTRAVENOUS

## 2018-08-12 MED ORDER — MORPHINE SULFATE (PF) 4 MG/ML IV SOLN
4.0000 mg | Freq: Once | INTRAVENOUS | Status: AC
  Administered 2018-08-12: 4 mg via INTRAVENOUS
  Filled 2018-08-12: qty 1

## 2018-08-12 MED ORDER — ALUM & MAG HYDROXIDE-SIMETH 200-200-20 MG/5ML PO SUSP
30.0000 mL | Freq: Once | ORAL | Status: AC
  Administered 2018-08-12: 30 mL via ORAL
  Filled 2018-08-12: qty 30

## 2018-08-12 MED ORDER — PANTOPRAZOLE SODIUM 40 MG IV SOLR
40.0000 mg | Freq: Once | INTRAVENOUS | Status: AC
  Administered 2018-08-12: 40 mg via INTRAVENOUS
  Filled 2018-08-12: qty 40

## 2018-08-12 MED ORDER — LIDOCAINE VISCOUS HCL 2 % MT SOLN
15.0000 mL | Freq: Once | OROMUCOSAL | Status: AC
  Administered 2018-08-12: 15 mL via ORAL
  Filled 2018-08-12: qty 15

## 2018-08-12 MED ORDER — DICYCLOMINE HCL 10 MG/ML IM SOLN
20.0000 mg | Freq: Once | INTRAMUSCULAR | Status: AC
  Administered 2018-08-12: 20 mg via INTRAMUSCULAR
  Filled 2018-08-12: qty 2

## 2018-08-12 MED ORDER — DICYCLOMINE HCL 20 MG PO TABS: 20.0000 mg | ORAL_TABLET | Freq: Three times a day (TID) | ORAL | 0 refills | Status: DC | PRN

## 2018-08-12 MED ORDER — IOHEXOL 300 MG/ML  SOLN
100.0000 mL | Freq: Once | INTRAMUSCULAR | Status: AC | PRN
  Administered 2018-08-12: 100 mL via INTRAVENOUS

## 2018-08-12 MED ORDER — ONDANSETRON 4 MG PO TBDP: 4.0000 mg | ORAL_TABLET | Freq: Four times a day (QID) | ORAL | 0 refills | Status: DC | PRN

## 2018-08-12 MED ORDER — LORAZEPAM 2 MG/ML IJ SOLN
0.5000 mg | Freq: Once | INTRAMUSCULAR | Status: AC
  Administered 2018-08-12: 0.5 mg via INTRAVENOUS
  Filled 2018-08-12: qty 1

## 2018-08-12 NOTE — ED Triage Notes (Signed)
Pt c/o abd pain. States was seen two weeks ago for same. Pt crying stating that "I have been drinking again".

## 2018-08-12 NOTE — ED Notes (Signed)
ED Provider at bedside. 
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
## 2018-08-12 NOTE — ED Notes (Signed)
Patient reports lower abd pain since last night with one episode of diarrhea and multiple episodes of vomiting, reports hx of pain similar in nature in the past when she was drinking more heavily. Endorses having 2-3 "small" beers prior to symptoms beginning.

## 2018-08-12 NOTE — ED Notes (Signed)
Patient transported to CT 

## 2018-08-12 NOTE — Discharge Instructions (Addendum)
Please return for any problem.  Follow-up with your regular care provider as instructed.  Drink alcohol in moderation.  Use Bentyl and Zofran as prescribed for symptomatic relief.

## 2018-08-12 NOTE — ED Provider Notes (Signed)
Patient seen after signout from prior ED provider.  Work-up today - including CT imaging - does not reveal significant acute pathology.  Patient does feel improved following her treatment in the ED.  Repeat abdominal exam is benign.  Patient is appropriate for discharge.   Importance of close follow-up is stressed.  Strict return precautions given and understood.   Valarie Merino, MD 08/12/18 [phone]

## 2018-08-12 NOTE — ED Triage Notes (Signed)
Sitting up in bed crying , Pt reported needing to go to BR. I assisted Pt to BR.

## 2018-08-12 NOTE — ED Notes (Signed)
Patient verbalizes understanding of discharge instructions . Opportunity for questions and answers were provided . Armband removed by staff ,Pt discharged from ED. W/C  offered at D/C  and Declined W/C at D/C and was escorted to lobby by RN.  

## 2018-08-27 ENCOUNTER — Encounter (HOSPITAL_COMMUNITY): Payer: Self-pay | Admitting: *Deleted

## 2018-08-27 ENCOUNTER — Emergency Department (HOSPITAL_COMMUNITY)
Admission: EM | Admit: 2018-08-27 | Discharge: 2018-08-28 | Disposition: A | Discharge status: Home / Self Care | Payer: Medicare HMO | Attending: Emergency Medicine | Admitting: Emergency Medicine

## 2018-08-27 ENCOUNTER — Other Ambulatory Visit: Payer: Self-pay

## 2018-08-27 DIAGNOSIS — Z7984 Long term (current) use of oral hypoglycemic drugs: Secondary | ICD-10-CM | POA: Diagnosis not present

## 2018-08-27 DIAGNOSIS — F17210 Nicotine dependence, cigarettes, uncomplicated: Secondary | ICD-10-CM | POA: Insufficient documentation

## 2018-08-27 DIAGNOSIS — Z79899 Other long term (current) drug therapy: Secondary | ICD-10-CM | POA: Diagnosis not present

## 2018-08-27 DIAGNOSIS — R1013 Epigastric pain: Secondary | ICD-10-CM

## 2018-08-27 DIAGNOSIS — K2920 Alcoholic gastritis without bleeding: Secondary | ICD-10-CM | POA: Insufficient documentation

## 2018-08-27 DIAGNOSIS — E1165 Type 2 diabetes mellitus with hyperglycemia: Secondary | ICD-10-CM | POA: Insufficient documentation

## 2018-08-27 DIAGNOSIS — R739 Hyperglycemia, unspecified: Secondary | ICD-10-CM

## 2018-08-27 DIAGNOSIS — Z853 Personal history of malignant neoplasm of breast: Secondary | ICD-10-CM | POA: Insufficient documentation

## 2018-08-27 LAB — COMPREHENSIVE METABOLIC PANEL
ALT: 30 U/L (ref 0–44)
AST: 24 U/L (ref 15–41)
Albumin: 4.8 g/dL (ref 3.5–5.0)
Alkaline Phosphatase: 88 U/L (ref 38–126)
Anion gap: 18 — ABNORMAL HIGH (ref 5–15)
BUN: 7 mg/dL (ref 6–20)
CO2: 19 mmol/L — ABNORMAL LOW (ref 22–32)
Calcium: 10.7 mg/dL — ABNORMAL HIGH (ref 8.9–10.3)
Chloride: 103 mmol/L (ref 98–111)
Creatinine, Ser: 0.81 mg/dL (ref 0.44–1.00)
GFR calc Af Amer: >60 mL/min (ref >60)
GFR calc non Af Amer: >60 mL/min (ref >60)
Glucose, Bld: 245 mg/dL — ABNORMAL HIGH (ref 70–99)
Potassium: 3.5 mmol/L (ref 3.5–5.1)
Sodium: 140 mmol/L (ref 135–145)
Total Bilirubin: 0.6 mg/dL (ref 0.3–1.2)
Total Protein: 8.6 g/dL — ABNORMAL HIGH (ref 6.5–8.1)

## 2018-08-27 LAB — LIPASE, BLOOD: Lipase: 28 U/L (ref 11–51)

## 2018-08-27 LAB — CBC
HCT: 45.4 % (ref 36.0–46.0)
Hemoglobin: 15.9 g/dL — ABNORMAL HIGH (ref 12.0–15.0)
MCH: 30.7 pg (ref 26.0–34.0)
MCHC: 35 g/dL (ref 30.0–36.0)
MCV: 87.6 fL (ref 80.0–100.0)
Platelets: 409 10*3/uL — ABNORMAL HIGH (ref 150–400)
RBC: 5.18 MIL/uL — ABNORMAL HIGH (ref 3.87–5.11)
RDW: 12.1 % (ref 11.5–15.5)
WBC: 13.3 10*3/uL — ABNORMAL HIGH (ref 4.0–10.5)
nRBC: 0 % (ref 0.0–0.2)

## 2018-08-27 MED ORDER — ONDANSETRON 4 MG PO TBDP
4.0000 mg | ORAL_TABLET | Freq: Once | ORAL | Status: AC
  Administered 2018-08-28: 4 mg via ORAL
  Filled 2018-08-27: qty 1

## 2018-08-27 MED ORDER — OXYCODONE-ACETAMINOPHEN 5-325 MG PO TABS
2.0000 | ORAL_TABLET | Freq: Once | ORAL | Status: AC
  Administered 2018-08-28: 2 via ORAL
  Filled 2018-08-27: qty 2

## 2018-08-27 MED ORDER — SODIUM CHLORIDE 0.9% FLUSH: 3.0000 mL | Freq: Once | INTRAVENOUS | Status: DC

## 2018-08-27 NOTE — ED Triage Notes (Signed)
Pt says she had been drinking alcohol last night and started having abdominal pain since, associated with vomiting and diarrhea, no fevers.

## 2018-08-28 DIAGNOSIS — K2920 Alcoholic gastritis without bleeding: Secondary | ICD-10-CM | POA: Diagnosis not present

## 2018-08-28 LAB — POCT I-STAT EG7
Acid-base deficit: 1 mmol/L (ref 0.0–2.0)
Bicarbonate: 24.2 mmol/L (ref 20.0–28.0)
Calcium, Ion: 1.31 mmol/L (ref 1.15–1.40)
HCT: 48 % — ABNORMAL HIGH (ref 36.0–46.0)
Hemoglobin: 16.3 g/dL — ABNORMAL HIGH (ref 12.0–15.0)
O2 Saturation: 53 %
Potassium: 3.9 mmol/L (ref 3.5–5.1)
Sodium: 141 mmol/L (ref 135–145)
TCO2: 25 mmol/L (ref 22–32)
pCO2, Ven: 42.1 mmHg — ABNORMAL LOW (ref 44.0–60.0)
pH, Ven: 7.367 (ref 7.250–7.430)
pO2, Ven: 29 mmHg — CL (ref 32.0–45.0)

## 2018-08-28 MED ORDER — ALUM & MAG HYDROXIDE-SIMETH 200-200-20 MG/5ML PO SUSP
30.0000 mL | Freq: Once | ORAL | Status: AC
  Administered 2018-08-28: 30 mL via ORAL
  Filled 2018-08-28: qty 30

## 2018-08-28 NOTE — ED Notes (Signed)
Pt brought back to triage for reassessment, clear yellow emesis.

## 2018-08-28 NOTE — ED Notes (Signed)
Pt provided some cranberry juice

## 2018-08-28 NOTE — Discharge Instructions (Signed)
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
## 2019-09-14 ENCOUNTER — Emergency Department (HOSPITAL_COMMUNITY)
Admission: EM | Admit: 2019-09-14 | Discharge: 2019-09-14 | Disposition: A | Discharge status: Home / Self Care | Payer: Medicare HMO | Attending: Emergency Medicine | Admitting: Emergency Medicine

## 2019-09-14 ENCOUNTER — Other Ambulatory Visit: Payer: Self-pay

## 2019-09-14 ENCOUNTER — Emergency Department (HOSPITAL_COMMUNITY): Payer: Medicare HMO

## 2019-09-14 ENCOUNTER — Encounter (HOSPITAL_COMMUNITY): Payer: Self-pay

## 2019-09-14 DIAGNOSIS — Z853 Personal history of malignant neoplasm of breast: Secondary | ICD-10-CM | POA: Insufficient documentation

## 2019-09-14 DIAGNOSIS — R1110 Vomiting, unspecified: Secondary | ICD-10-CM | POA: Insufficient documentation

## 2019-09-14 DIAGNOSIS — Z7984 Long term (current) use of oral hypoglycemic drugs: Secondary | ICD-10-CM | POA: Diagnosis not present

## 2019-09-14 DIAGNOSIS — R109 Unspecified abdominal pain: Secondary | ICD-10-CM

## 2019-09-14 DIAGNOSIS — E119 Type 2 diabetes mellitus without complications: Secondary | ICD-10-CM | POA: Insufficient documentation

## 2019-09-14 DIAGNOSIS — R197 Diarrhea, unspecified: Secondary | ICD-10-CM | POA: Insufficient documentation

## 2019-09-14 DIAGNOSIS — R1031 Right lower quadrant pain: Secondary | ICD-10-CM | POA: Insufficient documentation

## 2019-09-14 DIAGNOSIS — F17200 Nicotine dependence, unspecified, uncomplicated: Secondary | ICD-10-CM | POA: Diagnosis not present

## 2019-09-14 DIAGNOSIS — J45909 Unspecified asthma, uncomplicated: Secondary | ICD-10-CM | POA: Diagnosis not present

## 2019-09-14 LAB — URINALYSIS, ROUTINE W REFLEX MICROSCOPIC
Bacteria, UA: NONE SEEN
Bilirubin Urine: NEGATIVE
Glucose, UA: >=500 mg/dL — AB
Ketones, ur: 20 mg/dL — AB
Leukocytes,Ua: NEGATIVE
Nitrite: NEGATIVE
Protein, ur: 30 mg/dL — AB
Specific Gravity, Urine: 1.025 (ref 1.005–1.030)
pH: 6 (ref 5.0–8.0)

## 2019-09-14 LAB — COMPREHENSIVE METABOLIC PANEL
ALT: 26 U/L (ref 0–44)
AST: 30 U/L (ref 15–41)
Albumin: 4.5 g/dL (ref 3.5–5.0)
Alkaline Phosphatase: 74 U/L (ref 38–126)
Anion gap: 18 — ABNORMAL HIGH (ref 5–15)
BUN: 11 mg/dL (ref 6–20)
CO2: 21 mmol/L — ABNORMAL LOW (ref 22–32)
Calcium: 10.8 mg/dL — ABNORMAL HIGH (ref 8.9–10.3)
Chloride: 101 mmol/L (ref 98–111)
Creatinine, Ser: 0.88 mg/dL (ref 0.44–1.00)
GFR calc Af Amer: >60 mL/min (ref >60)
GFR calc non Af Amer: >60 mL/min (ref >60)
Glucose, Bld: 238 mg/dL — ABNORMAL HIGH (ref 70–99)
Potassium: 3.6 mmol/L (ref 3.5–5.1)
Sodium: 140 mmol/L (ref 135–145)
Total Bilirubin: 0.2 mg/dL — ABNORMAL LOW (ref 0.3–1.2)
Total Protein: 8.3 g/dL — ABNORMAL HIGH (ref 6.5–8.1)

## 2019-09-14 LAB — LIPASE, BLOOD: Lipase: 32 U/L (ref 11–51)

## 2019-09-14 LAB — CBC
HCT: 43.1 % (ref 36.0–46.0)
Hemoglobin: 14.7 g/dL (ref 12.0–15.0)
MCH: 29.9 pg (ref 26.0–34.0)
MCHC: 34.1 g/dL (ref 30.0–36.0)
MCV: 87.8 fL (ref 80.0–100.0)
Platelets: 368 10*3/uL (ref 150–400)
RBC: 4.91 MIL/uL (ref 3.87–5.11)
RDW: 12.6 % (ref 11.5–15.5)
WBC: 8.8 10*3/uL (ref 4.0–10.5)
nRBC: 0 % (ref 0.0–0.2)

## 2019-09-14 MED ORDER — PANTOPRAZOLE SODIUM 20 MG PO TBEC
20.0000 mg | DELAYED_RELEASE_TABLET | Freq: Two times a day (BID) | ORAL | 0 refills | Status: DC
Start: 2019-09-14 — End: 2019-11-12

## 2019-09-14 MED ORDER — FAMOTIDINE IN NACL 20-0.9 MG/50ML-% IV SOLN
20.0000 mg | INTRAVENOUS | Status: AC
  Administered 2019-09-14: 20 mg via INTRAVENOUS
  Filled 2019-09-14: qty 50

## 2019-09-14 MED ORDER — METOCLOPRAMIDE HCL 5 MG/ML IJ SOLN
10.0000 mg | INTRAMUSCULAR | Status: AC
  Administered 2019-09-14: 10 mg via INTRAVENOUS
  Filled 2019-09-14: qty 2

## 2019-09-14 MED ORDER — ONDANSETRON 4 MG PO TBDP
4.0000 mg | ORAL_TABLET | Freq: Once | ORAL | Status: AC
  Administered 2019-09-14: 4 mg via ORAL
  Filled 2019-09-14: qty 1

## 2019-09-14 MED ORDER — FENTANYL CITRATE (PF) 100 MCG/2ML IJ SOLN
100.0000 ug | Freq: Once | INTRAMUSCULAR | Status: AC
  Administered 2019-09-14: 100 ug via INTRAVENOUS
  Filled 2019-09-14: qty 2

## 2019-09-14 MED ORDER — SODIUM CHLORIDE 0.9% FLUSH
3.0000 mL | Freq: Once | INTRAVENOUS | Status: AC
  Administered 2019-09-14: 3 mL via INTRAVENOUS

## 2019-09-14 MED ORDER — ONDANSETRON HCL 4 MG/2ML IJ SOLN
4.0000 mg | Freq: Once | INTRAMUSCULAR | Status: AC
  Administered 2019-09-14: 4 mg via INTRAVENOUS
  Filled 2019-09-14: qty 2

## 2019-09-14 MED ORDER — LACTATED RINGERS IV BOLUS
1000.0000 mL | Freq: Once | INTRAVENOUS | Status: AC
  Administered 2019-09-14: 1000 mL via INTRAVENOUS

## 2019-09-14 MED ORDER — ONDANSETRON 8 MG PO TBDP: 8.0000 mg | ORAL_TABLET | Freq: Three times a day (TID) | ORAL | 0 refills | Status: AC | PRN

## 2019-09-14 MED ORDER — IOHEXOL 300 MG/ML  SOLN
100.0000 mL | Freq: Once | INTRAMUSCULAR | Status: AC | PRN
  Administered 2019-09-14: 100 mL via INTRAVENOUS

## 2019-09-14 MED ORDER — DICYCLOMINE HCL 20 MG PO TABS: 20.0000 mg | ORAL_TABLET | Freq: Two times a day (BID) | ORAL | 0 refills | Status: AC

## 2019-09-14 NOTE — ED Provider Notes (Signed)
Pt seen by Dr Rex Kras initially.  Please see her notes.  CT scan without acute findings.  No findings of pancreatitis bowel obstruction or diverticulitis.  Symptoms may be related to gastritis.   Dorie Rank, MD 09/14/19 [phone]

## 2019-09-14 NOTE — Discharge Instructions (Signed)
Take the medications as prescribed.  Follow-up with your primary doctor to be rechecked.  Avoid any alcohol use
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
## 2019-09-14 NOTE — ED Notes (Signed)
Patient verbalizes understanding of discharge instructions. Opportunity for questioning and answers were provided. Armband removed by staff, pt discharged from ED to home 

## 2019-09-14 NOTE — ED Triage Notes (Addendum)
Pt arrives to ED w/ c/o 10/10 RLQ abdominal pain w/ n/v that started this AM. Pt endorses heavy ETOH last night Tearful in triage.

## 2019-11-11 ENCOUNTER — Emergency Department (HOSPITAL_COMMUNITY)
Admission: EM | Admit: 2019-11-11 | Discharge: 2019-11-12 | Disposition: A | Discharge status: Home / Self Care | Payer: Medicare HMO | Attending: Emergency Medicine | Admitting: Emergency Medicine

## 2019-11-11 ENCOUNTER — Other Ambulatory Visit: Payer: Self-pay

## 2019-11-11 ENCOUNTER — Encounter (HOSPITAL_COMMUNITY): Payer: Self-pay

## 2019-11-11 DIAGNOSIS — Z853 Personal history of malignant neoplasm of breast: Secondary | ICD-10-CM | POA: Diagnosis not present

## 2019-11-11 DIAGNOSIS — J45909 Unspecified asthma, uncomplicated: Secondary | ICD-10-CM | POA: Diagnosis not present

## 2019-11-11 DIAGNOSIS — K2920 Alcoholic gastritis without bleeding: Secondary | ICD-10-CM

## 2019-11-11 DIAGNOSIS — E119 Type 2 diabetes mellitus without complications: Secondary | ICD-10-CM | POA: Diagnosis not present

## 2019-11-11 DIAGNOSIS — R1013 Epigastric pain: Secondary | ICD-10-CM | POA: Diagnosis present

## 2019-11-11 DIAGNOSIS — F17200 Nicotine dependence, unspecified, uncomplicated: Secondary | ICD-10-CM | POA: Diagnosis not present

## 2019-11-11 DIAGNOSIS — Z7984 Long term (current) use of oral hypoglycemic drugs: Secondary | ICD-10-CM | POA: Diagnosis not present

## 2019-11-11 DIAGNOSIS — I10 Essential (primary) hypertension: Secondary | ICD-10-CM | POA: Diagnosis not present

## 2019-11-11 LAB — CBC
HCT: 44.1 % (ref 36.0–46.0)
Hemoglobin: 14.7 g/dL (ref 12.0–15.0)
MCH: 29.7 pg (ref 26.0–34.0)
MCHC: 33.3 g/dL (ref 30.0–36.0)
MCV: 89.1 fL (ref 80.0–100.0)
Platelets: 405 10*3/uL — ABNORMAL HIGH (ref 150–400)
RBC: 4.95 MIL/uL (ref 3.87–5.11)
RDW: 12.3 % (ref 11.5–15.5)
WBC: 10.7 10*3/uL — ABNORMAL HIGH (ref 4.0–10.5)
nRBC: 0 % (ref 0.0–0.2)

## 2019-11-11 LAB — COMPREHENSIVE METABOLIC PANEL
ALT: 28 U/L (ref 0–44)
AST: 26 U/L (ref 15–41)
Albumin: 4.5 g/dL (ref 3.5–5.0)
Alkaline Phosphatase: 76 U/L (ref 38–126)
Anion gap: 19 — ABNORMAL HIGH (ref 5–15)
BUN: 10 mg/dL (ref 6–20)
CO2: 20 mmol/L — ABNORMAL LOW (ref 22–32)
Calcium: 10.6 mg/dL — ABNORMAL HIGH (ref 8.9–10.3)
Chloride: 103 mmol/L (ref 98–111)
Creatinine, Ser: 0.91 mg/dL (ref 0.44–1.00)
GFR calc Af Amer: >60 mL/min (ref >60)
GFR calc non Af Amer: >60 mL/min (ref >60)
Glucose, Bld: 228 mg/dL — ABNORMAL HIGH (ref 70–99)
Potassium: 3.4 mmol/L — ABNORMAL LOW (ref 3.5–5.1)
Sodium: 142 mmol/L (ref 135–145)
Total Bilirubin: 0.6 mg/dL (ref 0.3–1.2)
Total Protein: 8 g/dL (ref 6.5–8.1)

## 2019-11-11 LAB — I-STAT BETA HCG BLOOD, ED (MC, WL, AP ONLY): I-stat hCG, quantitative: <5 m[IU]/mL (ref <5)

## 2019-11-11 LAB — LIPASE, BLOOD: Lipase: 30 U/L (ref 11–51)

## 2019-11-11 MED ORDER — PANTOPRAZOLE SODIUM 40 MG IV SOLR
40.0000 mg | Freq: Once | INTRAVENOUS | Status: AC
  Administered 2019-11-11: 40 mg via INTRAVENOUS
  Filled 2019-11-11: qty 40

## 2019-11-11 MED ORDER — SODIUM CHLORIDE 0.9 % IV BOLUS
1000.0000 mL | Freq: Once | INTRAVENOUS | Status: AC
  Administered 2019-11-11: 1000 mL via INTRAVENOUS

## 2019-11-11 MED ORDER — ONDANSETRON 4 MG PO TBDP
4.0000 mg | ORAL_TABLET | Freq: Once | ORAL | Status: AC | PRN
  Administered 2019-11-11: 4 mg via ORAL
  Filled 2019-11-11: qty 1

## 2019-11-11 MED ORDER — DROPERIDOL 2.5 MG/ML IJ SOLN
2.5000 mg | Freq: Once | INTRAMUSCULAR | Status: AC
  Administered 2019-11-11: 2.5 mg via INTRAVENOUS
  Filled 2019-11-11: qty 2

## 2019-11-11 NOTE — ED Notes (Signed)
Pt still having abd pain with n/v. Is hypertensive, states she has no hx of HTN and does not take any meds. Given zofran for her n/v.

## 2019-11-11 NOTE — ED Triage Notes (Signed)
Severe abdominal pain with n/v/diarrhea since last night after drinking "too much tequila". Pt states she was here for same last month. Reports drinking once a week on average
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
## 2019-11-12 ENCOUNTER — Emergency Department (HOSPITAL_COMMUNITY): Payer: Medicare HMO

## 2019-11-12 DIAGNOSIS — K2920 Alcoholic gastritis without bleeding: Secondary | ICD-10-CM | POA: Diagnosis not present

## 2019-11-12 LAB — URINALYSIS, ROUTINE W REFLEX MICROSCOPIC
Bacteria, UA: NONE SEEN
Bilirubin Urine: NEGATIVE
Glucose, UA: >=500 mg/dL — AB
Ketones, ur: 20 mg/dL — AB
Leukocytes,Ua: NEGATIVE
Nitrite: NEGATIVE
Protein, ur: 100 mg/dL — AB
Specific Gravity, Urine: 1.015 (ref 1.005–1.030)
pH: 5 (ref 5.0–8.0)

## 2019-11-12 MED ORDER — SUCRALFATE 1 G PO TABS
1.0000 g | ORAL_TABLET | Freq: Once | ORAL | Status: AC
  Administered 2019-11-12: 1 g via ORAL
  Filled 2019-11-12: qty 1

## 2019-11-12 MED ORDER — SUCRALFATE 1 G PO TABS: 1.0000 g | ORAL_TABLET | Freq: Three times a day (TID) | ORAL | 0 refills | Status: AC

## 2019-11-12 MED ORDER — ALUM & MAG HYDROXIDE-SIMETH 200-200-20 MG/5ML PO SUSP
30.0000 mL | Freq: Once | ORAL | Status: AC
  Administered 2019-11-12: 30 mL via ORAL
  Filled 2019-11-12: qty 30

## 2019-11-12 MED ORDER — PANTOPRAZOLE SODIUM 20 MG PO TBEC: 20.0000 mg | DELAYED_RELEASE_TABLET | Freq: Two times a day (BID) | ORAL | 0 refills | Status: AC

## 2019-11-12 NOTE — Discharge Instructions (Addendum)
You were seen today for abdominal pain.  This is likely related to alcoholic gastritis.  You need to consider discontinuing alcohol use.  Take medications as prescribed.  You were noted to have high blood pressure.  Follow-up with your primary doctor for recheck.

## 2020-01-26 ENCOUNTER — Other Ambulatory Visit: Payer: Self-pay | Admitting: Nurse Practitioner

## 2020-01-26 DIAGNOSIS — Z1231 Encounter for screening mammogram for malignant neoplasm of breast: Secondary | ICD-10-CM

## 2020-01-29 ENCOUNTER — Ambulatory Visit: Payer: Medicare HMO

## 2021-11-07 IMAGING — CT CT ABD-PELV W/ CM
2 of 5 series · 16 of 46 positions shown, 18 images · IV contrast (Omni 300)
Comparison: 08/12/2018

CLINICAL DATA: Right lower quadrant abdominal pain, nausea,
vomiting

EXAM:
CT ABDOMEN AND PELVIS WITH CONTRAST
TECHNIQUE: Multidetector CT imaging of the abdomen and pelvis was performed
using the standard protocol following bolus administration of
intravenous contrast.
CONTRAST:  100mL OMNIPAQUE IOHEXOL 300 MG/ML  SOLN

[Series 3: a/p w/ 5mm · axial · 0.78mm/px · z∈[+808,+1213]mm · 13 of 93 slices shown, 15 images]
[im 6/93  soft-tissue]
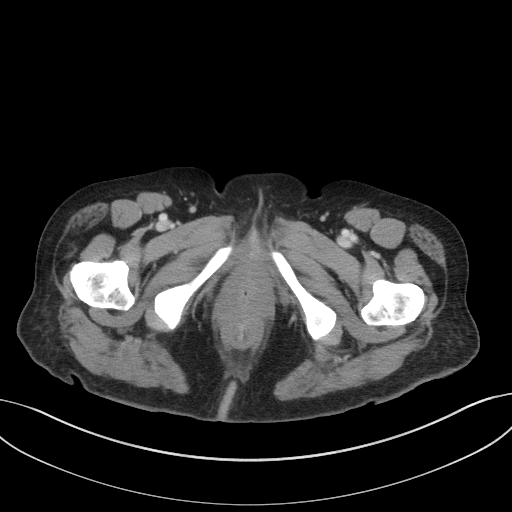
[im 6/93  bone]
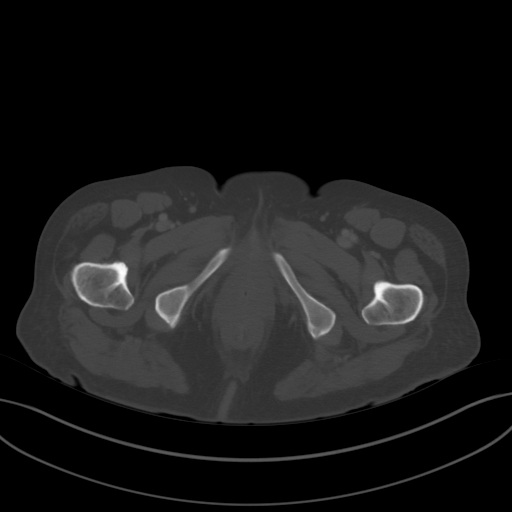
[im 11/93  soft-tissue]
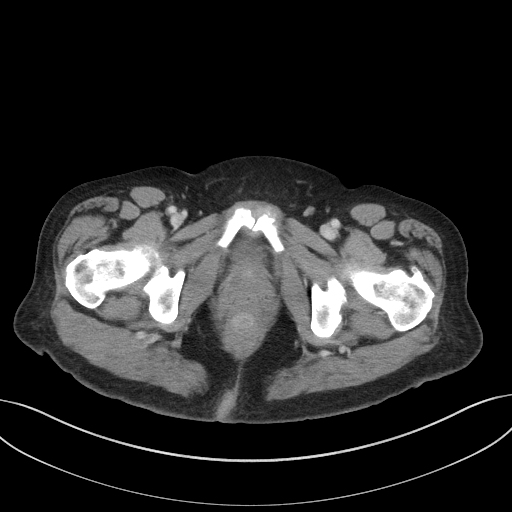
[im 21/93  soft-tissue]
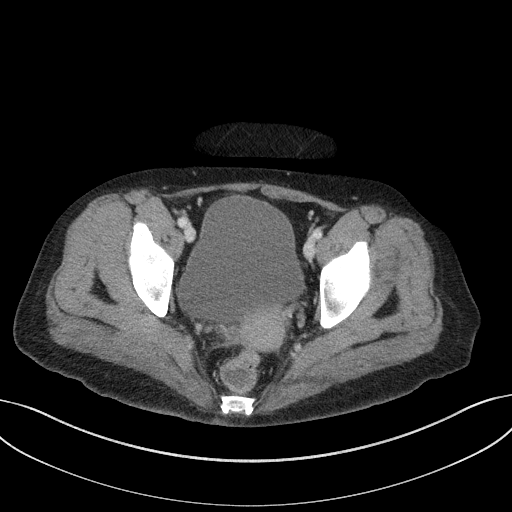
[im 26/93  soft-tissue]
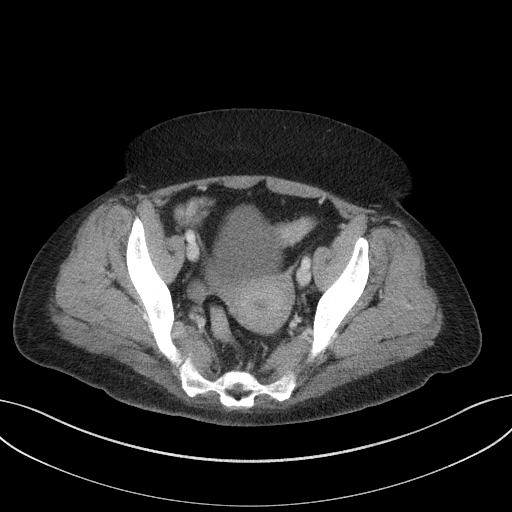
[im 31/93  soft-tissue]
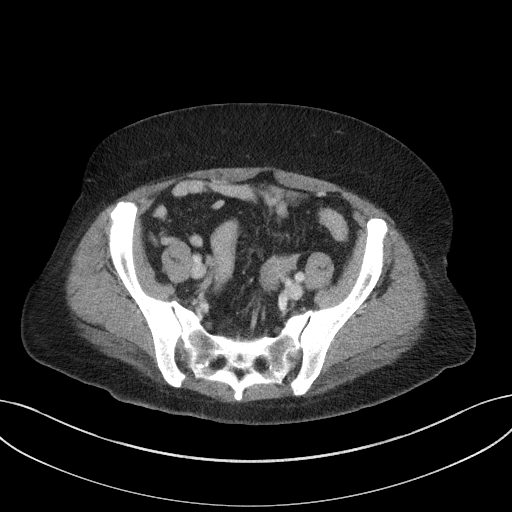
[im 41/93  soft-tissue]
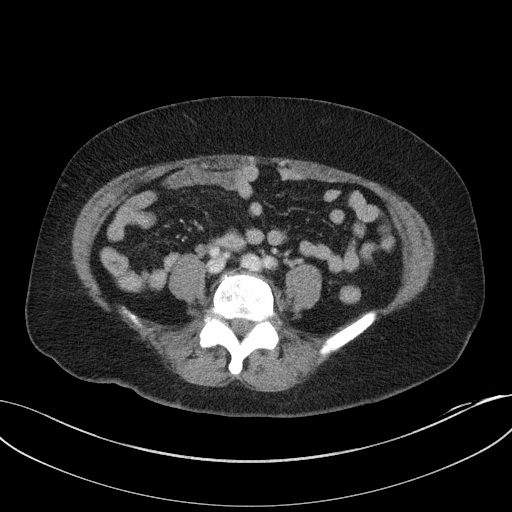
[im 47/93  soft-tissue]
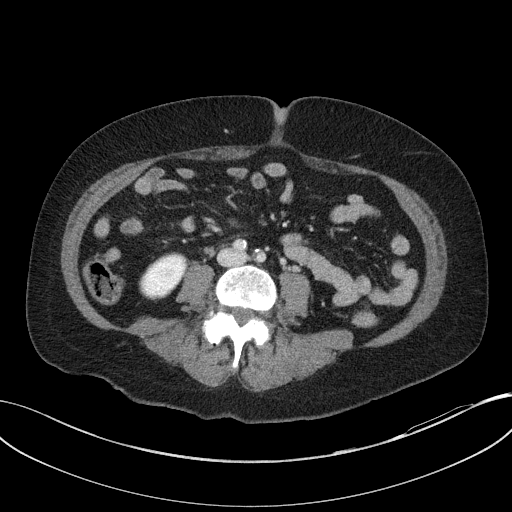
[im 52/93  soft-tissue]
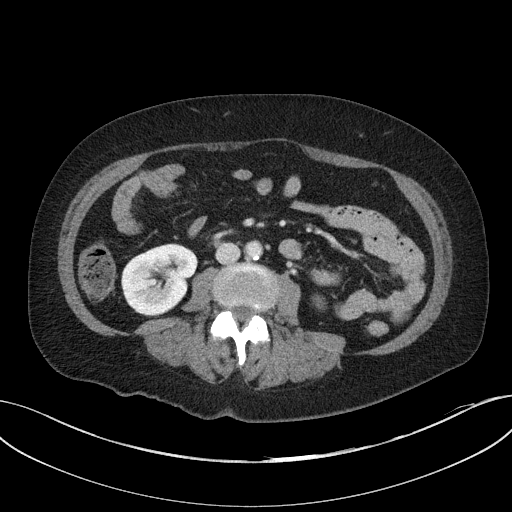
[im 62/93  soft-tissue]
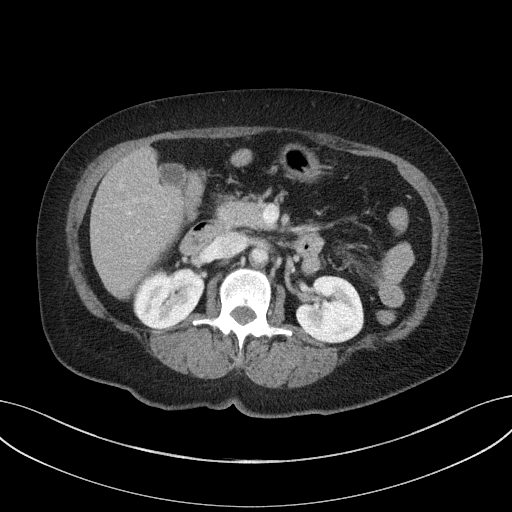
[im 62/93  bone]
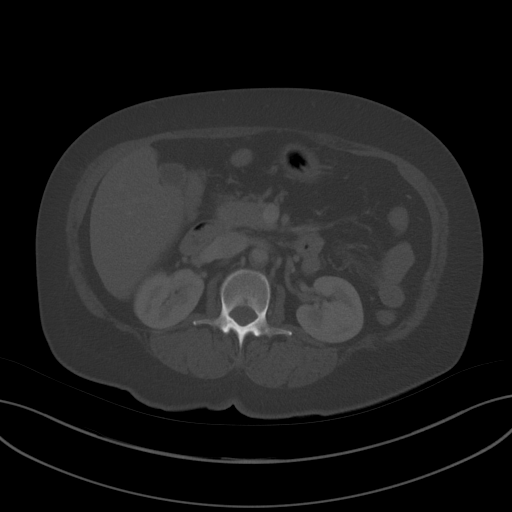
[im 67/93  soft-tissue]
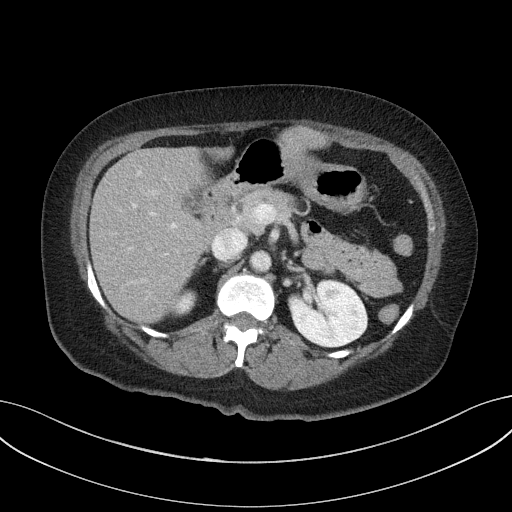
[im 72/93  soft-tissue]
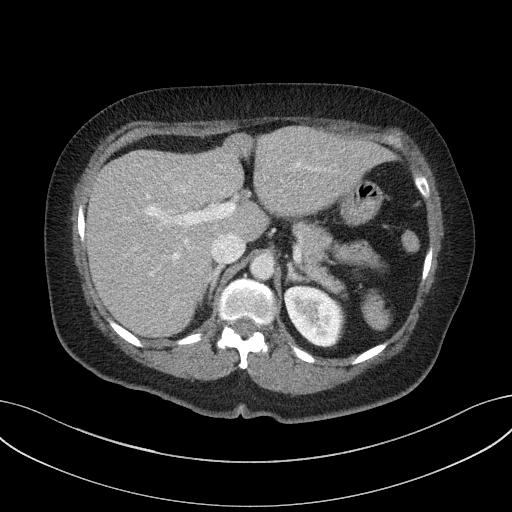
[im 82/93  soft-tissue]
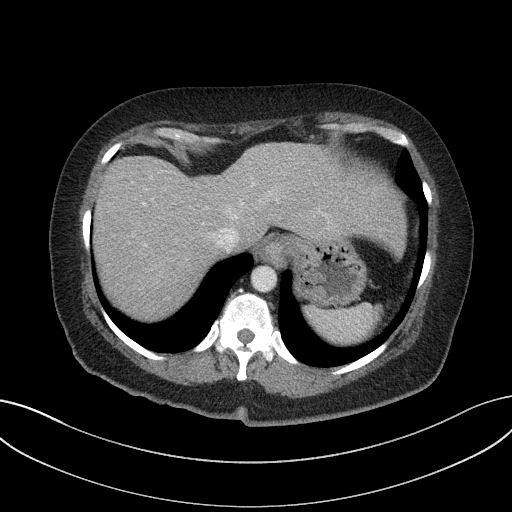
[im 87/93  soft-tissue]
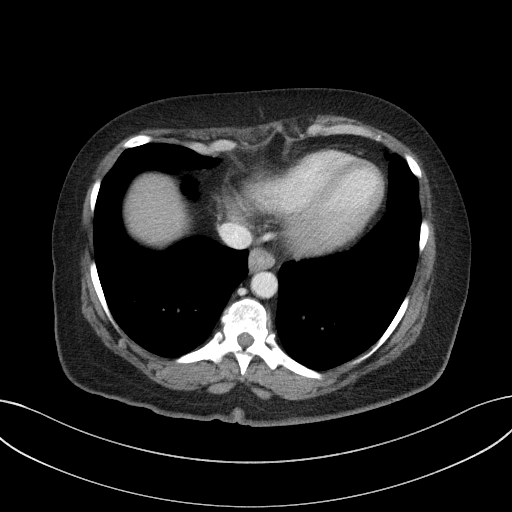

[Series 6: a/p w/ cor · coronal · 0.80mm/px · 3 of 125 slices shown]
[im 42/125  soft-tissue]
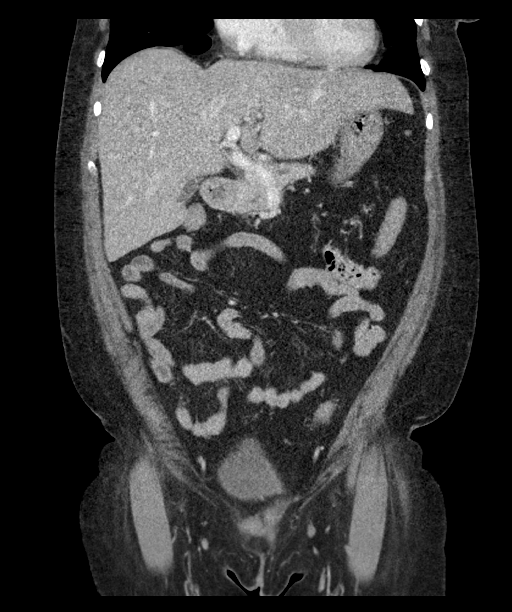
[im 56/125  soft-tissue]
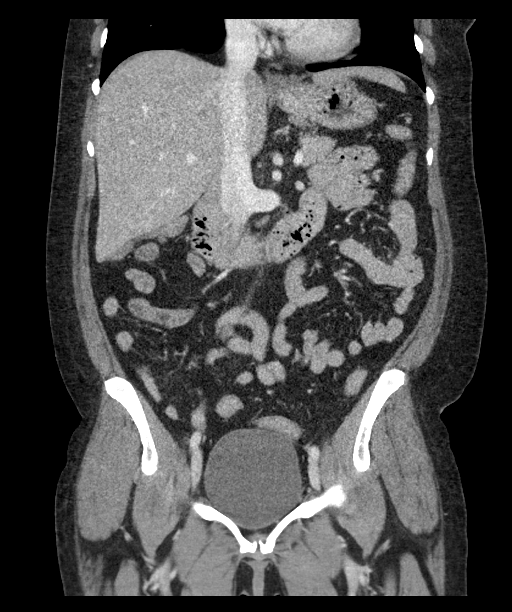
[im 69/125  soft-tissue]
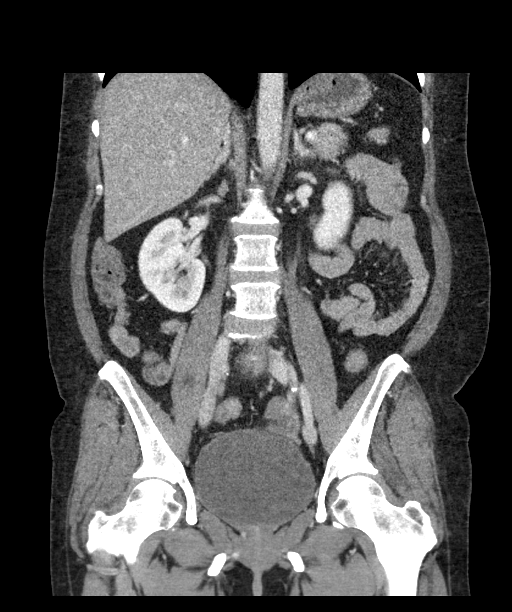

[16 of 46 positions shown; findings below may reference images not displayed]

FINDINGS: Lower chest: Stable pneumatocele at the right lung base. The
visualized lung bases are otherwise clear. The visualized heart and
pericardium are unremarkable. Small hiatal hernia. Circumferential
thickening of the distal esophagus is again seen and is nonspecific,
but may reflect changes of esophagitis, potentially related to
reflux.

Hepatobiliary: Mild hepatic steatosis. No intra or extrahepatic
biliary ductal dilation. Gallbladder unremarkable.

Pancreas: Unremarkable

Spleen: Unremarkable

Adrenals/Urinary Tract: The adrenal glands and kidneys are
unremarkable. The bladder is unremarkable.

Stomach/Bowel: The stomach, small bowel, and large bowel are
unremarkable. Appendix absent. No free intraperitoneal gas or fluid

Vascular/Lymphatic: Minimal aortoiliac atherosclerotic calcification
without evidence of aneurysm. The abdominal vasculature is otherwise
unremarkable. No pathologic adenopathy within the abdomen and
pelvis.

Reproductive: Unremarkable

Other: Rectum unremarkable.

Musculoskeletal: Degenerative changes are seen within the
lumbosacral junction. No acute bone abnormality.
IMPRESSION: No radiographic explanation for the patient's reported symptoms.
Incidental findings as noted above.

Aortic Atherosclerosis ([id number]-[id number].S).

## 2022-01-05 IMAGING — CR DG ABDOMEN ACUTE W/ 1V CHEST
3 series · 3 of 3 positions shown · non-contrast
Comparison: None.

CLINICAL DATA: Epigastric abdominal pain

EXAM:
ACUTE ABDOMEN SERIES (2 VIEW ABDOMEN AND 1 VIEW CHEST)

[abdomen erect]
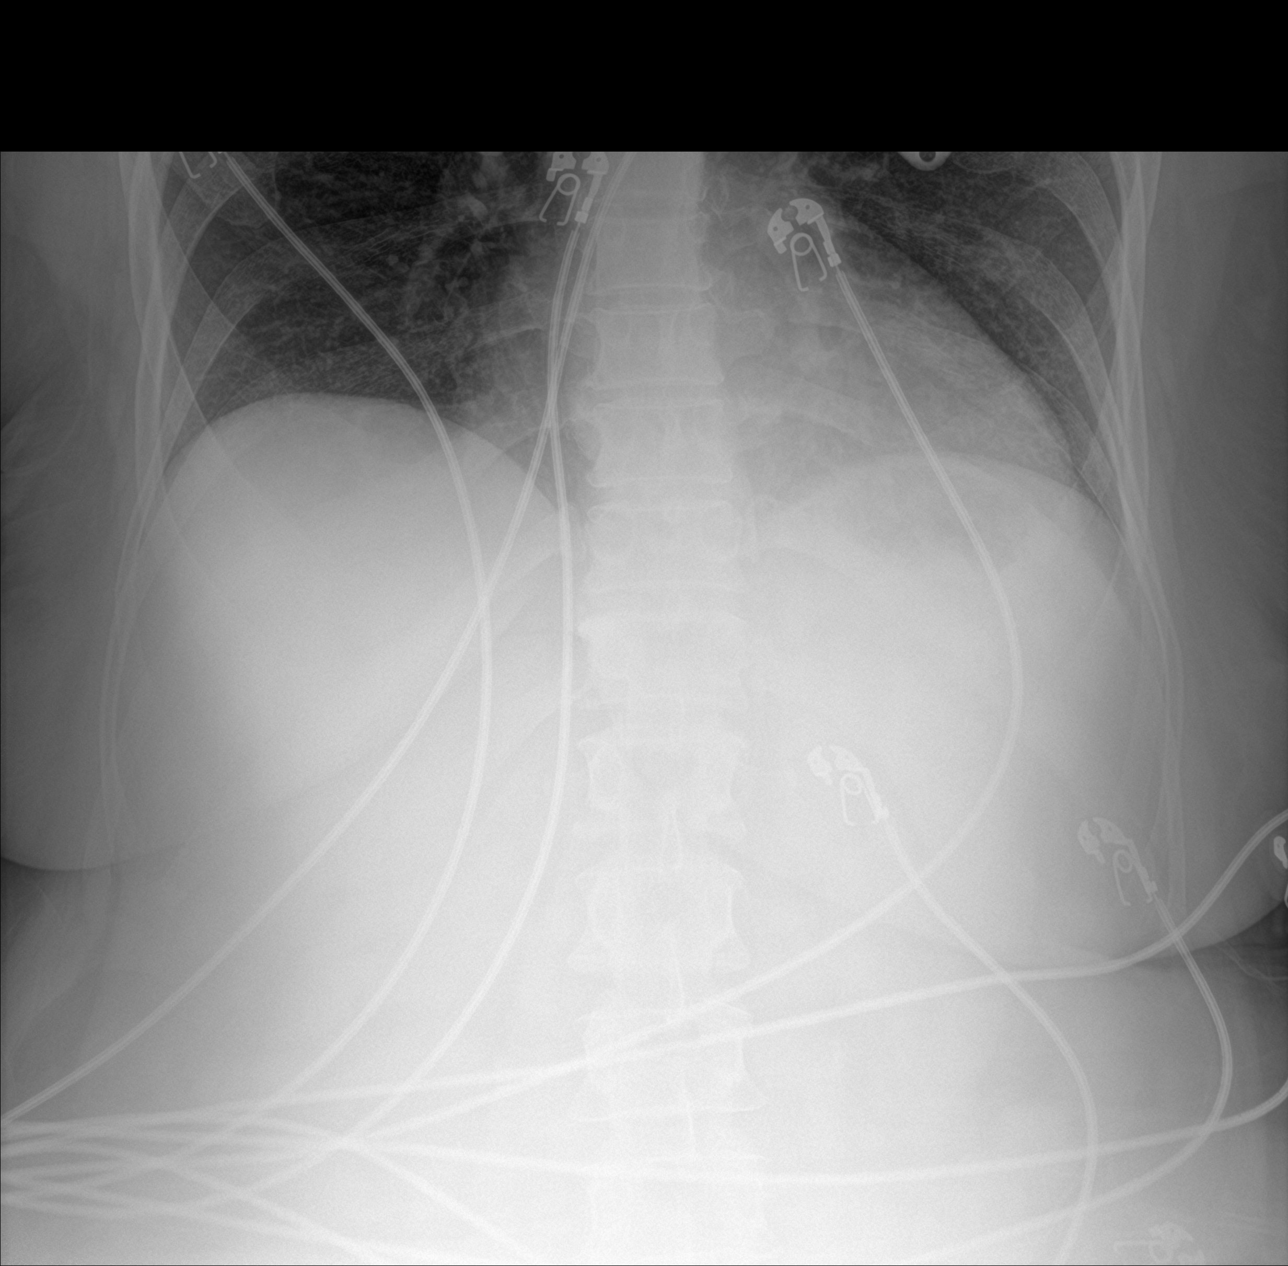

[abdomen supine]
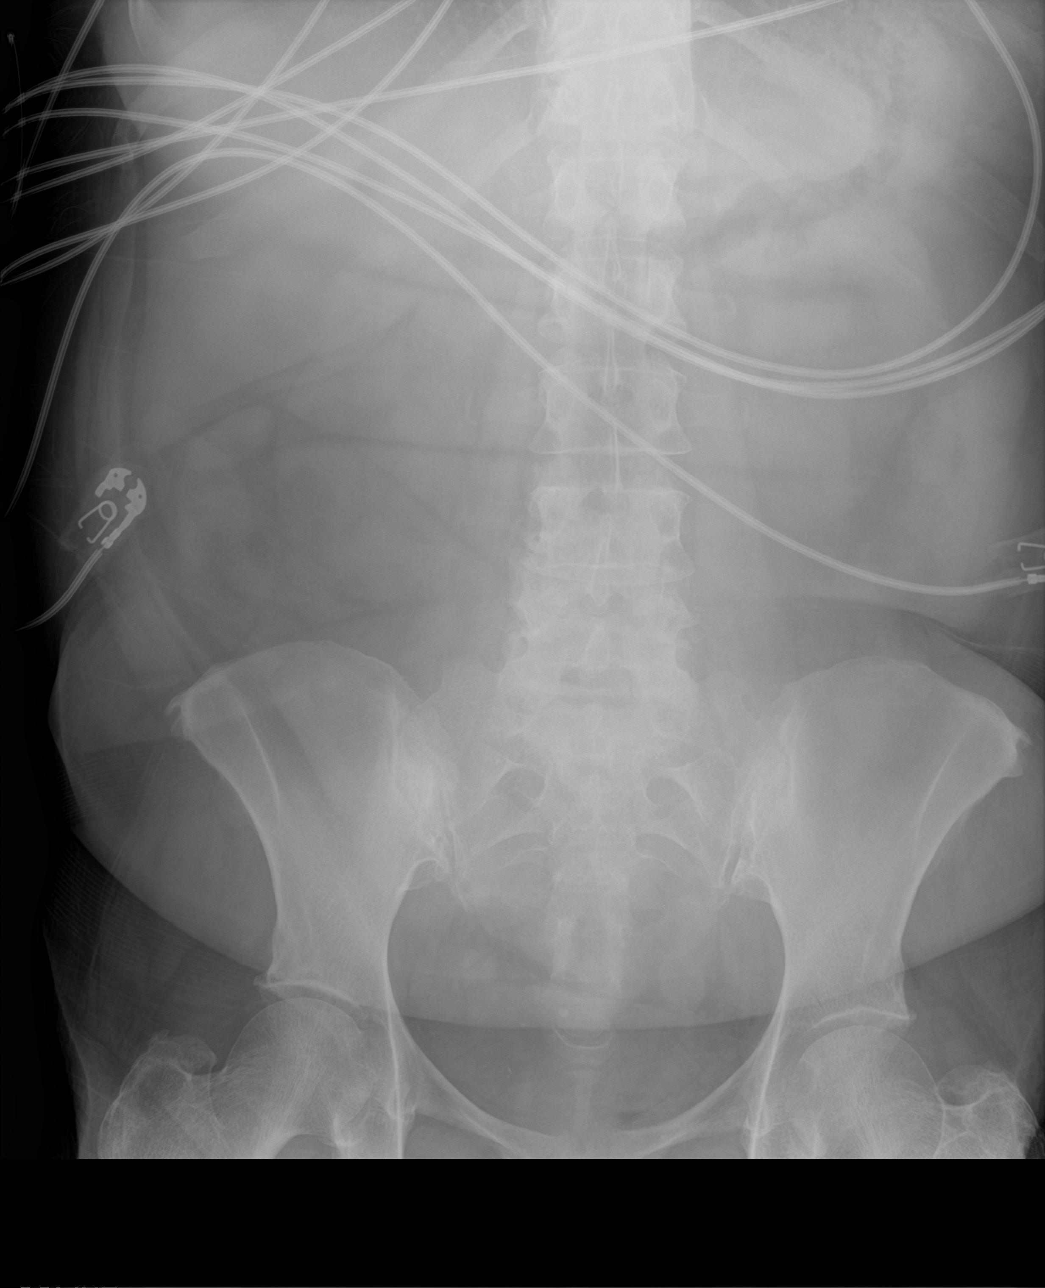

[chest ap]
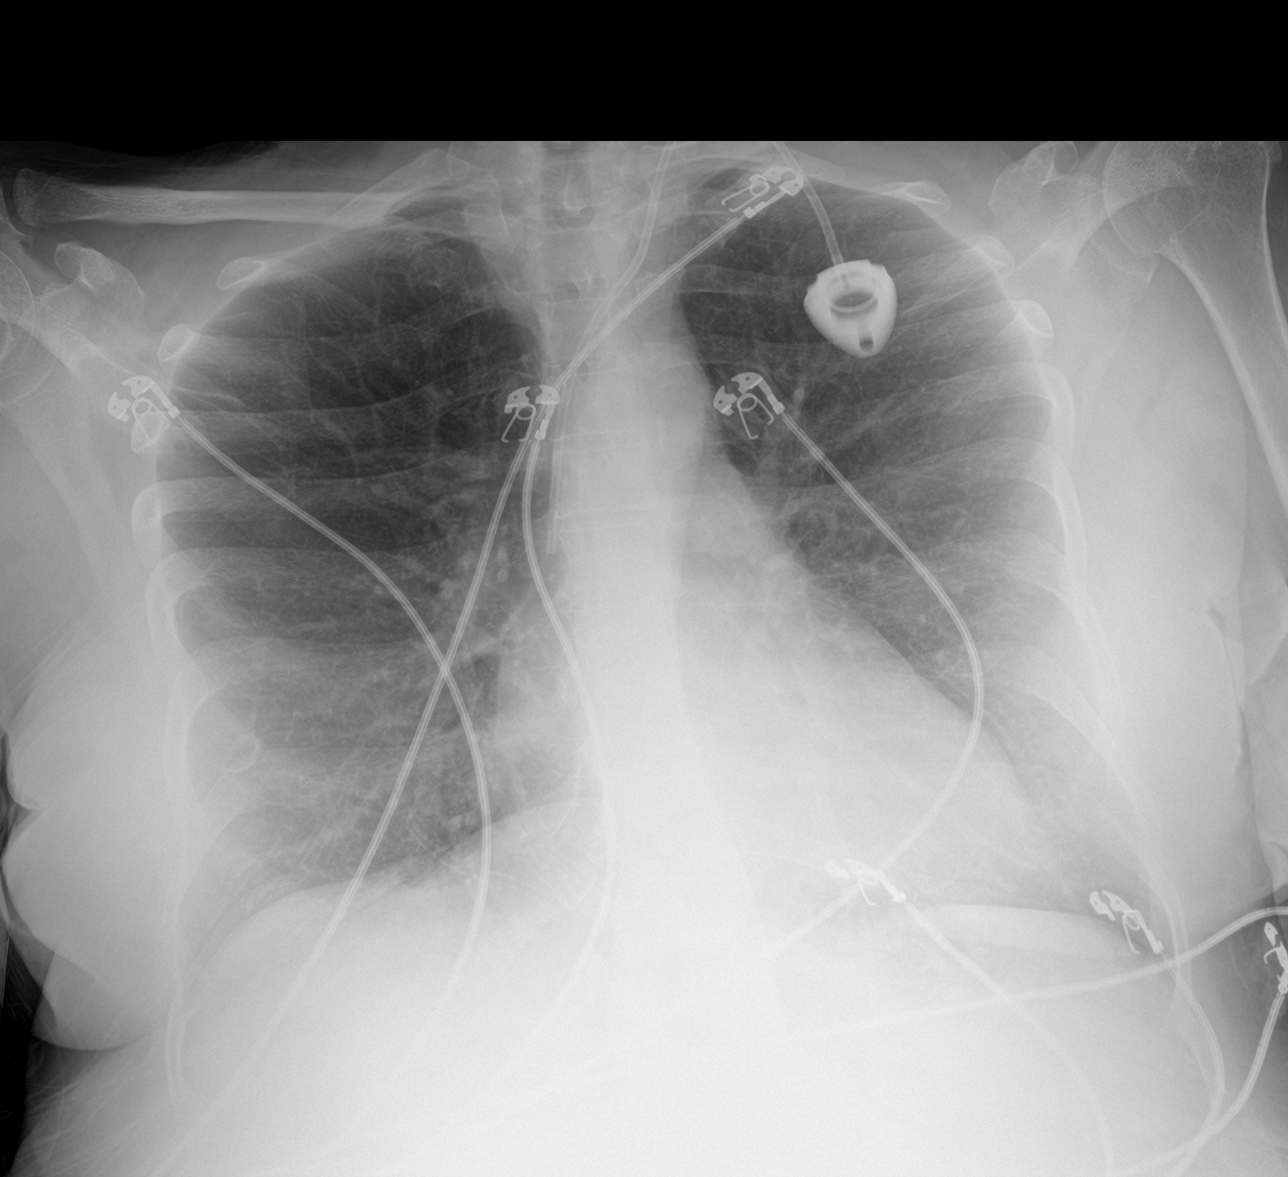

[3 of 3 positions shown; findings below may reference images not displayed]

FINDINGS: Lungs are clear. No pneumothorax or pleural effusion. Left internal
jugular chest port is seen with its tip within the superior vena
cava. Cardiac size within normal limits. The pulmonary vascularity
is normal.

The abdominal gas pattern is indeterminate due to a paucity of
intra-abdominal gas. There is no free intraperitoneal gas.
Phleboliths within the mid pelvis. No abnormal calcifications within
the abdomen and pelvis. Osseous structures are age-appropriate.
IMPRESSION: No radiographic evidence of acute cardiopulmonary disease.

Indeterminate abdominal gas pattern.  No free air.
